# Patient Record
Sex: Female | Born: 1985 | Race: White | Hispanic: No | Marital: Married | Smoking: Former smoker
Health system: Southern US, Community
[De-identification: ages and names within clinical notes are randomized; demographics above are authoritative.]

## PROBLEM LIST (undated history)

## (undated) ENCOUNTER — Inpatient Hospital Stay: Payer: Self-pay

## (undated) DIAGNOSIS — B9689 Other specified bacterial agents as the cause of diseases classified elsewhere: Secondary | ICD-10-CM

## (undated) DIAGNOSIS — N760 Acute vaginitis: Secondary | ICD-10-CM

---

## 2015-09-10 ENCOUNTER — Encounter: Payer: Self-pay | Admitting: *Deleted

## 2015-09-10 ENCOUNTER — Emergency Department
Admission: EM | Admit: 2015-09-10 | Discharge: 2015-09-11 | Disposition: A | Discharge status: Home / Self Care | Payer: Medicaid Other | Attending: Emergency Medicine | Admitting: Emergency Medicine

## 2015-09-10 DIAGNOSIS — Z79899 Other long term (current) drug therapy: Secondary | ICD-10-CM | POA: Diagnosis not present

## 2015-09-10 DIAGNOSIS — O210 Mild hyperemesis gravidarum: Secondary | ICD-10-CM | POA: Insufficient documentation

## 2015-09-10 DIAGNOSIS — Z87891 Personal history of nicotine dependence: Secondary | ICD-10-CM | POA: Insufficient documentation

## 2015-09-10 DIAGNOSIS — Z3A01 Less than 8 weeks gestation of pregnancy: Secondary | ICD-10-CM | POA: Diagnosis not present

## 2015-09-10 HISTORY — DX: Other specified bacterial agents as the cause of diseases classified elsewhere: B96.89

## 2015-09-10 HISTORY — DX: Acute vaginitis: N76.0

## 2015-09-10 LAB — URINALYSIS COMPLETE WITH MICROSCOPIC (ARMC ONLY)
Bilirubin Urine: NEGATIVE
Glucose, UA: NEGATIVE mg/dL
Hgb urine dipstick: NEGATIVE
KETONES UR: NEGATIVE mg/dL
Leukocytes, UA: NEGATIVE
Nitrite: NEGATIVE
PROTEIN: NEGATIVE mg/dL
RBC / HPF: NONE SEEN RBC/hpf (ref 0–5)
Specific Gravity, Urine: 1.012 (ref 1.005–1.030)
pH: 9 — ABNORMAL HIGH (ref 5.0–8.0)

## 2015-09-10 LAB — POCT PREGNANCY, URINE: PREG TEST UR: POSITIVE — AB

## 2015-09-10 NOTE — ED Notes (Signed)
Pregnancy POCT results were Positive.

## 2015-09-10 NOTE — ED Notes (Signed)
Pt c/o unrelenting nausea x 3 days. Pt denies vomiting. Pt states she is unable to work secondary to constant nausea. Pt has had no prenatal care up to this point. Pt states every past pregnancy she required medication throughout to prevent nausea.

## 2015-09-11 LAB — COMPREHENSIVE METABOLIC PANEL
ALT: 11 U/L — ABNORMAL LOW (ref 14–54)
ANION GAP: 7 (ref 5–15)
AST: 16 U/L (ref 15–41)
Albumin: 4.1 g/dL (ref 3.5–5.0)
Alkaline Phosphatase: 50 U/L (ref 38–126)
BILIRUBIN TOTAL: 0.6 mg/dL (ref 0.3–1.2)
BUN: 10 mg/dL (ref 6–20)
CHLORIDE: 106 mmol/L (ref 101–111)
CO2: 24 mmol/L (ref 22–32)
Calcium: 8.7 mg/dL — ABNORMAL LOW (ref 8.9–10.3)
Creatinine, Ser: 0.81 mg/dL (ref 0.44–1.00)
GFR calc Af Amer: >60 mL/min (ref >60)
Glucose, Bld: 93 mg/dL (ref 65–99)
POTASSIUM: 3.8 mmol/L (ref 3.5–5.1)
Sodium: 137 mmol/L (ref 135–145)
TOTAL PROTEIN: 7 g/dL (ref 6.5–8.1)

## 2015-09-11 LAB — CBC WITH DIFFERENTIAL/PLATELET
BASOS ABS: 0 10*3/uL (ref 0–0.1)
Basophils Relative: 1 %
Eosinophils Absolute: 0 10*3/uL (ref 0–0.7)
Eosinophils Relative: 1 %
HEMATOCRIT: 37.6 % (ref 35.0–47.0)
HEMOGLOBIN: 12.5 g/dL (ref 12.0–16.0)
LYMPHS PCT: 21 %
Lymphs Abs: 2.1 10*3/uL (ref 1.0–3.6)
MCH: 26.2 pg (ref 26.0–34.0)
MCHC: 33.2 g/dL (ref 32.0–36.0)
MCV: 78.8 fL — AB (ref 80.0–100.0)
Monocytes Absolute: 0.5 10*3/uL (ref 0.2–0.9)
Monocytes Relative: 5 %
NEUTROS ABS: 7.4 10*3/uL — AB (ref 1.4–6.5)
NEUTROS PCT: 72 %
PLATELETS: 169 10*3/uL (ref 150–440)
RBC: 4.77 MIL/uL (ref 3.80–5.20)
RDW: 17.4 % — ABNORMAL HIGH (ref 11.5–14.5)
WBC: 10.1 10*3/uL (ref 3.6–11.0)

## 2015-09-11 LAB — OB RESULTS CONSOLE RPR: RPR: NONREACTIVE

## 2015-09-11 NOTE — Discharge Instructions (Signed)
Morning Sickness Morning sickness is when you feel sick to your stomach (nauseous) during pregnancy. You may feel sick to your stomach and throw up (vomit). You may feel sick in the morning, but you can feel this way any time of day. Some women feel very sick to their stomach and cannot stop throwing up (hyperemesis gravidarum). HOME CARE  Only take medicines as told by your doctor.  Take multivitamins as told by your doctor. Taking multivitamins before getting pregnant can stop or lessen the harshness of morning sickness.  Eat dry toast or unsalted crackers before getting out of bed.  Eat 5 to 6 small meals a day.  Eat dry and bland foods like rice and baked potatoes.  Do not drink liquids with meals. Drink between meals.  Do not eat greasy, fatty, or spicy foods.  Have someone cook for you if the smell of food causes you to feel sick or throw up.  If you feel sick to your stomach after taking prenatal vitamins, take them at night or with a snack.  Eat protein when you need a snack (nuts, yogurt, cheese).  Eat unsweetened gelatins for dessert.  Wear a bracelet used for sea sickness (acupressure wristband).  Go to a doctor that puts thin needles into certain body points (acupuncture) to improve how you feel.  Do not smoke.  Use a humidifier to keep the air in your house free of odors.  Get lots of fresh air. GET HELP IF:  You need medicine to feel better.  You feel dizzy or lightheaded.  You are losing weight. GET HELP RIGHT AWAY IF:   You feel very sick to your stomach and cannot stop throwing up.  You pass out (faint). MAKE SURE YOU:  Understand these instructions.  Will watch your condition.  Will get help right away if you are not doing well or get worse. Document Released: 01/18/2005 Document Revised: 12/16/2013 Document Reviewed: 05/28/2013 [facility] Patient Information 2015 [place], [place]. This information is not intended to replace advice given to you by  your health care provider. Make sure you discuss any questions you have with your health care provider.  Continue to try the ginger ale. He can also try to see if Medicaid will cover the likely just medicine. If not I will chew the prescription for the doxylamine and histamine and the pyridoxine or vitamin B6 separately they may cover that work might be cheap enough he can bite without any difficulty. If not try either the Phenergan first or the Zofran and afterwards picture to follow up with your doctor as planned

## 2015-09-11 NOTE — ED Provider Notes (Signed)
[facility] Emergency Department Nichole Krause Note  ____________________________________________  Time seen: Approximately 12:33 AM  I have reviewed the triage vital signs and the nursing notes.   HISTORY  Chief Complaint Morning Sickness    HPI Nichole Krause is a 29 y.o. female who complains of morning sickness. Patient's been nauseated almost constantly for several days. Patient reports with her last 2 pregnancies she is Zofran. I discussed with them in new recommendations to try either ginger and patient reports she's been used drinking ginger ale without any improvement. Or the vitamin B6 and doxylamine. We will try that first and then I will give her a prescription also for Phenergan 12.5 up to 3 times a day if needed patient reports a 25 mg has made her sleepy in the past and if that doesn't work will try the Zofran. She denies any fever chills dysuria urgency frequency or any other complaints only of nausea.   Past Medical History  Diagnosis Date  . BV (bacterial vaginosis)     There are no active problems to display for this patient.   History reviewed. No pertinent past surgical history.  Current Outpatient Rx  Name  Route  Sig  Dispense  Refill  . Prenatal Vit-Fe Fumarate-FA (PRENATAL MULTIVITAMIN) TABS tablet   Oral   Take 1 tablet by mouth daily at 12 noon.           Allergies Review of patient's allergies indicates no known allergies.  History reviewed. No pertinent family history.  Social History Social History  Substance Use Topics  . Smoking status: Former Games developer  . Smokeless tobacco: Never Used  . Alcohol Use: No    Review of Systems Constitutional: No fever/chills Eyes: No visual changes. ENT: No sore throat. Cardiovascular: Denies chest pain. Respiratory: Denies shortness of breath. Gastrointestinal: No abdominal pain.  No nausea, no vomiting.  No diarrhea.  No constipation. Genitourinary: Negative for  dysuria. Musculoskeletal: Negative for back pain. Skin: Negative for rash. Neurological: Negative for headaches, focal weakness or numbness. { 10-point ROS otherwise negative.  ____________________________________________   PHYSICAL EXAM:  VITAL SIGNS: ED Triage Vitals  Enc Vitals Group     BP 09/10/15 1941 116/69 mmHg     Pulse Rate 09/10/15 1941 79     Resp 09/10/15 1941 16     Temp 09/10/15 1941 97.6 F (36.4 C)     Temp Source 09/10/15 1941 Oral     SpO2 09/10/15 1941 99 %     Weight 09/10/15 1941 121 lb (54.885 kg)     Height 09/10/15 1941  (1.575 m)     Head Cir --      Peak Flow --      Pain Score --      Pain Loc --      Pain Edu? --      Excl. in GC? --     Constitutional: Alert and oriented. Well appearing and in no acute distress. Eyes: Conjunctivae are normal. PERRL. EOMI. Head: Atraumatic. Nose: No congestion/rhinnorhea. Mouth/Throat: Mucous membranes are moist.  Oropharynx non-erythematous. Neck: No stridor.   Cardiovascular: Normal rate, regular rhythm. Grossly normal heart sounds.  Good peripheral circulation. Respiratory: Normal respiratory effort.  No retractions. Lungs CTAB. Gastrointestinal: Soft and nontender. No distention. No abdominal bruits. No CVA tenderness. Musculoskeletal: No lower extremity tenderness nor edema.  No joint effusions. Neurologic:  Normal speech and language. No gross focal neurologic deficits are appreciated. No gait instability. Skin:  Skin is warm, dry  and intact. No rash noted. Psychiatric: Mood and affect are normal. Speech and behavior are normal.  ____________________________________________   LABS (all labs ordered are listed, but only abnormal results are displayed)  Labs Reviewed  URINALYSIS COMPLETEWITH MICROSCOPIC ([facility] ONLY) - Abnormal; Notable for the following:    Color, Urine YELLOW (*)    APPearance HAZY (*)    pH 9.0 (*)    Bacteria, UA RARE (*)    Squamous Epithelial / LPF 6-30 (*)    All  other components within normal limits  COMPREHENSIVE METABOLIC PANEL - Abnormal; Notable for the following:    Calcium 8.7 (*)    ALT 11 (*)    All other components within normal limits  CBC WITH DIFFERENTIAL/PLATELET - Abnormal; Notable for the following:    MCV 78.8 (*)    RDW 17.4 (*)    Neutro Abs 7.4 (*)    All other components within normal limits  POCT PREGNANCY, URINE - Abnormal; Notable for the following:    Preg Test, Ur POSITIVE (*)    All other components within normal limits  URINE CULTURE  POC URINE PREG, ED   ____________________________________________  EKG   ____________________________________________  RADIOLOGY   ____________________________________________   PROCEDURES    ____________________________________________   INITIAL IMPRESSION / ASSESSMENT AND PLAN / ED COURSE  Pertinent labs & imaging results that were available during my care of the patient were reviewed by me and considered in my medical decision making (see chart for details).   ____________________________________________   FINAL CLINICAL IMPRESSION(S) / ED DIAGNOSES  Final diagnoses:  Morning sickness      Arnaldo Natal, MD 09/11/15 [phone]

## 2015-09-11 NOTE — ED Notes (Signed)
Pt states is approx [redacted] weeks pregnant. Pt states "i'm nauseated, everyday, all the time. They have had to give me zofran with all my other pregnancies." pt denies pain. Pt states has been taking metronizole vaginal gel "for an infection". Pt denies diarrhea. Skin normal color warm and dry. Vss.

## 2015-09-13 LAB — URINE CULTURE: SPECIAL REQUESTS: NORMAL

## 2015-10-12 LAB — OB RESULTS CONSOLE HIV ANTIBODY (ROUTINE TESTING): HIV: NONREACTIVE

## 2015-10-12 LAB — OB RESULTS CONSOLE VARICELLA ZOSTER ANTIBODY, IGG: Varicella: UNDETERMINED

## 2015-10-12 LAB — OB RESULTS CONSOLE HEPATITIS B SURFACE ANTIGEN: Hepatitis B Surface Ag: NEGATIVE

## 2015-10-12 LAB — OB RESULTS CONSOLE RUBELLA ANTIBODY, IGM: Rubella: IMMUNE

## 2015-10-14 ENCOUNTER — Emergency Department
Admission: EM | Admit: 2015-10-14 | Discharge: 2015-10-14 | Disposition: A | Discharge status: Home / Self Care | Payer: Medicaid Other | Attending: Emergency Medicine | Admitting: Emergency Medicine

## 2015-10-14 ENCOUNTER — Encounter: Payer: Self-pay | Admitting: Emergency Medicine

## 2015-10-14 DIAGNOSIS — O30091 Twin pregnancy, unable to determine number of placenta and number of amniotic sacs, first trimester: Secondary | ICD-10-CM | POA: Insufficient documentation

## 2015-10-14 DIAGNOSIS — O210 Mild hyperemesis gravidarum: Secondary | ICD-10-CM | POA: Diagnosis not present

## 2015-10-14 DIAGNOSIS — Z79899 Other long term (current) drug therapy: Secondary | ICD-10-CM | POA: Diagnosis not present

## 2015-10-14 DIAGNOSIS — Z3491 Encounter for supervision of normal pregnancy, unspecified, first trimester: Secondary | ICD-10-CM

## 2015-10-14 DIAGNOSIS — Z3A10 10 weeks gestation of pregnancy: Secondary | ICD-10-CM | POA: Insufficient documentation

## 2015-10-14 DIAGNOSIS — Z87891 Personal history of nicotine dependence: Secondary | ICD-10-CM | POA: Diagnosis not present

## 2015-10-14 LAB — CBC WITH DIFFERENTIAL/PLATELET
BASOS ABS: 0 10*3/uL (ref 0–0.1)
BASOS PCT: 0 %
EOS PCT: 0 %
Eosinophils Absolute: 0 10*3/uL (ref 0–0.7)
HCT: 40 % (ref 35.0–47.0)
Hemoglobin: 13.6 g/dL (ref 12.0–16.0)
Lymphocytes Relative: 13 %
Lymphs Abs: 1.3 10*3/uL (ref 1.0–3.6)
MCH: 27.1 pg (ref 26.0–34.0)
MCHC: 34 g/dL (ref 32.0–36.0)
MCV: 79.9 fL — ABNORMAL LOW (ref 80.0–100.0)
MONO ABS: 0.5 10*3/uL (ref 0.2–0.9)
Monocytes Relative: 5 %
NEUTROS ABS: 8.5 10*3/uL — AB (ref 1.4–6.5)
Neutrophils Relative %: 82 %
PLATELETS: 173 10*3/uL (ref 150–440)
RBC: 5.01 MIL/uL (ref 3.80–5.20)
RDW: 18.4 % — AB (ref 11.5–14.5)
WBC: 10.3 10*3/uL (ref 3.6–11.0)

## 2015-10-14 LAB — URINALYSIS COMPLETE WITH MICROSCOPIC (ARMC ONLY)
BILIRUBIN URINE: NEGATIVE
Glucose, UA: >500 mg/dL — AB
Hgb urine dipstick: NEGATIVE
KETONES UR: NEGATIVE mg/dL
LEUKOCYTES UA: NEGATIVE
NITRITE: NEGATIVE
Protein, ur: NEGATIVE mg/dL
Specific Gravity, Urine: 1.017 (ref 1.005–1.030)
pH: 8 (ref 5.0–8.0)

## 2015-10-14 LAB — BASIC METABOLIC PANEL
ANION GAP: 8 (ref 5–15)
BUN: 9 mg/dL (ref 6–20)
CALCIUM: 9.1 mg/dL (ref 8.9–10.3)
CO2: 28 mmol/L (ref 22–32)
CREATININE: 0.72 mg/dL (ref 0.44–1.00)
Chloride: 102 mmol/L (ref 101–111)
GFR calc Af Amer: >60 mL/min (ref >60)
GLUCOSE: 104 mg/dL — AB (ref 65–99)
Potassium: 3.5 mmol/L (ref 3.5–5.1)
Sodium: 138 mmol/L (ref 135–145)

## 2015-10-14 MED ORDER — DEXTROSE 5 % AND 0.9 % NACL IV BOLUS
1000.0000 mL | Freq: Once | INTRAVENOUS | Status: AC
  Administered 2015-10-14: 1000 mL via INTRAVENOUS
  Filled 2015-10-14: qty 1000

## 2015-10-14 MED ORDER — PROMETHAZINE HCL 25 MG/ML IJ SOLN
25.0000 mg | Freq: Once | INTRAMUSCULAR | Status: AC
  Administered 2015-10-14: 25 mg via INTRAVENOUS
  Filled 2015-10-14: qty 1

## 2015-10-14 MED ORDER — ONDANSETRON HCL 4 MG/2ML IJ SOLN
4.0000 mg | Freq: Once | INTRAMUSCULAR | Status: AC
  Administered 2015-10-14: 4 mg via INTRAVENOUS
  Filled 2015-10-14: qty 2

## 2015-10-14 MED ORDER — SODIUM CHLORIDE 0.9 % IV BOLUS (SEPSIS)
1000.0000 mL | Freq: Once | INTRAVENOUS | Status: AC
  Administered 2015-10-14: 1000 mL via INTRAVENOUS

## 2015-10-14 MED ORDER — DIPHENHYDRAMINE HCL 50 MG/ML IJ SOLN
25.0000 mg | Freq: Once | INTRAMUSCULAR | Status: AC
  Administered 2015-10-14: 25 mg via INTRAVENOUS
  Filled 2015-10-14: qty 1

## 2015-10-14 MED ORDER — PROMETHAZINE HCL 25 MG PO TABS: 25.0000 mg | ORAL_TABLET | Freq: Four times a day (QID) | ORAL | Status: DC | PRN

## 2015-10-14 NOTE — Discharge Instructions (Signed)
First Trimester of Pregnancy  The first trimester of pregnancy is from week 1 until the end of week 12 (months 1 through 3). A week after a sperm fertilizes an egg, the egg will implant on the wall of the uterus. This embryo will begin to develop into a baby. Genes from you and your partner are forming the baby. The female genes determine whether the baby is a boy or a girl. At 6-8 weeks, the eyes and face are formed, and the heartbeat can be seen on ultrasound. At the end of 12 weeks, all the baby's organs are formed.   Now that you are pregnant, you will want to do everything you can to have a healthy baby. Two of the most important things are to get good prenatal care and to follow your health care provider's instructions. Prenatal care is all the medical care you receive before the baby's birth. This care will help prevent, find, and treat any problems during the pregnancy and childbirth.  BODY CHANGES  Your body goes through many changes during pregnancy. The changes vary from woman to woman.   · You may gain or lose a couple of pounds at first.  · You may feel sick to your stomach (nauseous) and throw up (vomit). If the vomiting is uncontrollable, call your health care provider.  · You may tire easily.  · You may develop headaches that can be relieved by medicines approved by your health care provider.  · You may urinate more often. Painful urination may mean you have a bladder infection.  · You may develop heartburn as a result of your pregnancy.  · You may develop constipation because certain hormones are causing the muscles that push waste through your intestines to slow down.  · You may develop hemorrhoids or swollen, bulging veins (varicose veins).  · Your breasts may begin to grow larger and become tender. Your nipples may stick out more, and the tissue that surrounds them (areola) may become darker.  · Your gums may bleed and may be sensitive to brushing and flossing.   · Dark spots or blotches (chloasma, mask of pregnancy) may develop on your face. This will likely fade after the baby is born.  · Your menstrual periods will stop.  · You may have a loss of appetite.  · You may develop cravings for certain kinds of food.  · You may have changes in your emotions from day to day, such as being excited to be pregnant or being concerned that something may go wrong with the pregnancy and baby.  · You may have more vivid and strange dreams.  · You may have changes in your hair. These can include thickening of your hair, rapid growth, and changes in texture. Some women also have hair loss during or after pregnancy, or hair that feels dry or thin. Your hair will most likely return to normal after your baby is born.  WHAT TO EXPECT AT YOUR PRENATAL VISITS  During a routine prenatal visit:  · You will be weighed to make sure you and the baby are growing normally.  · Your blood pressure will be taken.  · Your abdomen will be measured to track your baby's growth.  · The fetal heartbeat will be listened to starting around week 10 or 12 of your pregnancy.  · Test results from any previous visits will be discussed.  Your health care provider may ask you:  · How you are feeling.  · If you   including cigarettes, chewing tobacco, and electronic cigarettes. °· If you have any questions. °Other tests that may be performed during your first trimester include: °· Blood tests to find your blood type and to check for the presence of any previous infections. They will also be used to check for low iron levels (anemia) and Rh antibodies. Later in the pregnancy, blood tests for diabetes will be done along with other tests if problems develop. °· Urine tests to check for infections, diabetes, or protein in the urine. °· An ultrasound to confirm the proper growth  and development of the baby. °· An amniocentesis to check for possible genetic problems. °· Fetal screens for spina bifida and Down syndrome. °· You may need other tests to make sure you and the baby are doing well. °· HIV (human immunodeficiency virus) testing. Routine prenatal testing includes screening for HIV, unless you choose not to have this test. °HOME CARE INSTRUCTIONS  °Medicines °· Follow your health care provider's instructions regarding medicine use. Specific medicines may be either safe or unsafe to take during pregnancy. °· Take your prenatal vitamins as directed. °· If you develop constipation, try taking a stool softener if your health care provider approves. °Diet °· Eat regular, well-balanced meals. Choose a variety of foods, such as meat or vegetable-based protein, fish, milk and low-fat dairy products, vegetables, fruits, and whole grain breads and cereals. Your health care provider will help you determine the amount of weight gain that is right for you. °· Avoid raw meat and uncooked cheese. These carry germs that can cause birth defects in the baby. °· Eating four or five small meals rather than three large meals a day may help relieve nausea and vomiting. If you start to feel nauseous, eating a few soda crackers can be helpful. Drinking liquids between meals instead of during meals also seems to help nausea and vomiting. °· If you develop constipation, eat more high-fiber foods, such as fresh vegetables or fruit and whole grains. Drink enough fluids to keep your urine clear or pale yellow. °Activity and Exercise °· Exercise only as directed by your health care provider. Exercising will help you: °¨ Control your weight. °¨ Stay in shape. °¨ Be prepared for labor and delivery. °· Experiencing pain or cramping in the lower abdomen or low back is a good sign that you should stop exercising. Check with your health care provider before continuing normal exercises. °· Try to avoid standing for long  periods of time. Move your legs often if you must stand in one place for a long time. °· Avoid heavy lifting. °· Wear low-heeled shoes, and practice good posture. °· You may continue to have sex unless your health care provider directs you otherwise. °Relief of Pain or Discomfort °· Wear a good support bra for breast tenderness.   °· Take warm sitz baths to soothe any pain or discomfort caused by hemorrhoids. Use hemorrhoid cream if your health care provider approves.   °· Rest with your legs elevated if you have leg cramps or low back pain. °· If you develop varicose veins in your legs, wear support hose. Elevate your feet for 15 minutes, 3-4 times a day. Limit salt in your diet. °Prenatal Care °· Schedule your prenatal visits by the twelfth week of pregnancy. They are usually scheduled monthly at first, then more often in the last 2 months before delivery. °· Write down your questions. Take them to your prenatal visits. °· Keep all your prenatal visits as directed by your   health care provider. Safety  Wear your seat belt at all times when driving.  Make a list of emergency phone numbers, including numbers for family, friends, the hospital, and police and fire departments. General Tips  Ask your health care provider for a referral to a local prenatal education class. Begin classes no later than at the beginning of month 6 of your pregnancy.  Ask for help if you have counseling or nutritional needs during pregnancy. Your health care provider can offer advice or refer you to specialists for help with various needs.  Do not use hot tubs, steam rooms, or saunas.  Do not douche or use tampons or scented sanitary pads.  Do not cross your legs for long periods of time.  Avoid cat litter boxes and soil used by cats. These carry germs that can cause birth defects in the baby and possibly loss of the fetus by miscarriage or stillbirth.  Avoid all smoking, herbs, alcohol, and medicines not prescribed by  your health care provider. Chemicals in these affect the formation and growth of the baby.  Do not use any tobacco products, including cigarettes, chewing tobacco, and electronic cigarettes. If you need help quitting, ask your health care provider. You may receive counseling support and other resources to help you quit.  Schedule a dentist appointment. At home, brush your teeth with a soft toothbrush and be gentle when you floss. SEEK MEDICAL CARE IF:   You have dizziness.  You have mild pelvic cramps, pelvic pressure, or nagging pain in the abdominal area.  You have persistent nausea, vomiting, or diarrhea.  You have a bad smelling vaginal discharge.  You have pain with urination.  You notice increased swelling in your face, hands, legs, or ankles. SEEK IMMEDIATE MEDICAL CARE IF:   You have a fever.  You are leaking fluid from your vagina.  You have spotting or bleeding from your vagina.  You have severe abdominal cramping or pain.  You have rapid weight gain or loss.  You vomit blood or material that looks like coffee grounds.  You are exposed to [place] measles and have never had them.  You are exposed to fifth disease or chickenpox.  You develop a severe headache.  You have shortness of breath.  You have any kind of trauma, such as from a fall or a car accident.   This information is not intended to replace advice given to you by your health care provider. Make sure you discuss any questions you have with your health care provider.   Document Released: 12/05/2001 Document Revised: 01/01/2015 Document Reviewed: 10/21/2013 Elsevier Interactive Patient Education 2016 [employer].  Eating Plan for Hyperemesis Gravidarum Severe cases of hyperemesis gravidarum can lead to dehydration and malnutrition. The hyperemesis eating plan is one way to lessen the symptoms of nausea and vomiting. It is often used with prescribed medicines to control your symptoms.  WHAT CAN I DO  TO RELIEVE MY SYMPTOMS? Listen to your body. Everyone is different and has different preferences. Find what works best for you. Some of the following things may help:  Eat and drink slowly.  Eat 5-6 small meals daily instead of 3 large meals.   Eat crackers before you get out of bed in the morning.   Starchy foods are usually well tolerated (such as cereal, toast, bread, potatoes, pasta, rice, and pretzels).   Ginger may help with nausea. Add  tsp ground ginger to hot tea or choose ginger tea.   Try drinking 100%  fruit juice or an electrolyte drink.  Continue to take your prenatal vitamins as directed by your health care provider. If you are having trouble taking your prenatal vitamins, talk with your health care provider about different options.  Include at least 1 serving of protein with your meals and snacks (such as meats or poultry, beans, nuts, eggs, or yogurt). Try eating a protein-rich snack before bed (such as cheese and crackers or a half [place] or peanut butter sandwich). WHAT THINGS SHOULD I AVOID TO REDUCE MY SYMPTOMS? The following things may help reduce your symptoms:  Avoid foods with strong smells. Try eating meals in well-ventilated areas that are free of odors.  Avoid drinking water or other beverages with meals. Try not to drink anything less than 30 minutes before and after meals.  Avoid drinking more than 1 cup of fluid at a time.  Avoid fried or high-fat foods, such as butter and cream sauces.  Avoid spicy foods.  Avoid skipping meals the best you can. Nausea can be more intense on an empty stomach. If you cannot tolerate food at that time, do not force it. Try sucking on ice chips or other frozen items and make up the calories later.  Avoid lying down within 2 hours after eating.   This information is not intended to replace advice given to you by your health care provider. Make sure you discuss any questions you have with your health care provider.     Document Released: 10/08/2007 Document Revised: 12/16/2013 Document Reviewed: 10/15/2013 Elsevier Interactive Patient Education 2016 [employer] Inc[employer]  Morning Sickness Morning sickness is when you feel sick to your stomach (nauseous) during pregnancy. This nauseous feeling may or may not come with vomiting. It often occurs in the morning but can be a problem any time of day. Morning sickness is most common during the first trimester, but it may continue throughout pregnancy. While morning sickness is unpleasant, it is usually harmless unless you develop severe and continual vomiting (hyperemesis gravidarum). This condition requires more intense treatment.  CAUSES  The cause of morning sickness is not completely known but seems to be related to normal hormonal changes that occur in pregnancy. RISK FACTORS You are at greater risk if you:  Experienced nausea or vomiting before your pregnancy.  Had morning sickness during a previous pregnancy.  Are pregnant with more than one baby, such as twins. TREATMENT  Do not use any medicines (prescription, over-the-counter, or herbal) for morning sickness without first talking to your health care provider. Your health care provider may prescribe or recommend:  Vitamin B6 supplements.  Anti-nausea medicines.  The herbal medicine ginger. HOME CARE INSTRUCTIONS   Only take over-the-counter or prescription medicines as directed by your health care provider.  Taking multivitamins before getting pregnant can prevent or decrease the severity of morning sickness in most women.  Eat a piece of dry toast or unsalted crackers before getting out of bed in the morning.  Eat five or six small meals a day.  Eat dry and bland foods (rice, baked potato). Foods high in carbohydrates are often helpful.  Do not drink liquids with your meals. Drink liquids between meals.  Avoid greasy, fatty, and spicy foods.  Get someone to cook for you if the smell of any food  causes nausea and vomiting.  If you feel nauseous after taking prenatal vitamins, take the vitamins at night or with a snack.  Snack on protein foods (nuts, yogurt, cheese) between meals if you are hungry.  Eat unsweetened gelatins for desserts.  Wearing an acupressure wristband (worn for sea sickness) may be helpful.  Acupuncture may be helpful.  Do not smoke.  Get a humidifier to keep the air in your house free of odors.  Get plenty of fresh air. SEEK MEDICAL CARE IF:   Your home remedies are not working, and you need medicine.  You feel dizzy or lightheaded.  You are losing weight. SEEK IMMEDIATE MEDICAL CARE IF:   You have persistent and uncontrolled nausea and vomiting.  You pass out (faint). MAKE SURE YOU:  Understand these instructions.  Will watch your condition.  Will get help right away if you are not doing well or get worse.   This information is not intended to replace advice given to you by your health care provider. Make sure you discuss any questions you have with your health care provider.   Document Released: 02/01/2007 Document Revised: 12/16/2013 Document Reviewed: 05/28/2013 Elsevier Interactive Patient Education [employer]

## 2015-10-14 NOTE — ED Notes (Signed)
[redacted] weeks pregnant with twins and has vomiting.

## 2015-10-14 NOTE — ED Provider Notes (Signed)
[facility] Emergency Department Provider Note  ____________________________________________  Time seen: 8:30 AM  I have reviewed the triage vital signs and the nursing notes.   HISTORY  Chief Complaint Emesis During Pregnancy    HPI Nichole Krause is a 29 y.o. female [redacted] weeks pregnant with twins is been having nausea vomiting and oral intolerance for the past 2 days.She has a first appointment with the [facility] obstetric[facility] clinic in 4 days. No fever or chills vision changes headache or syncope. She does have nausea and vomiting, no diarrhea. No sick contacts, no chest pain shortness of breath. No contractions leakage of fluid vaginal bleeding.     Past Medical History  Diagnosis Date  . BV (bacterial vaginosis)      There are no active problems to display for this patient.  19 pregnancy, first trimester   No past surgical history on file. Negative  Current Outpatient Rx  Name  Route  Sig  Dispense  Refill  . Prenatal Vit-Fe Fumarate-FA (PRENATAL VITAMIN) 27-0.8 MG TABS   Oral   Take 1 tablet by mouth daily.         . prochlorperazine (COMPAZINE) 10 MG tablet   Oral   Take 10 mg by mouth every 6 (six) hours as needed for nausea or vomiting.          . promethazine (PHENERGAN) 25 MG tablet   Oral   Take 1 tablet (25 mg total) by mouth every 6 (six) hours as needed for nausea or vomiting.   15 tablet   0      Allergies Review of patient's allergies indicates no known allergies.   No family history on file.  Social History Social History  Substance Use Topics  . Smoking status: Former Games developermoker  . Smokeless tobacco: Never Used  . Alcohol Use: No    Review of Systems  Constitutional:   No fever or chills. No weight changes Eyes:   No blurry vision or double vision.  ENT:   No sore throat. Cardiovascular:   No chest pain. Respiratory:   No dyspnea or cough. Gastrointestinal:   Negative for abdominal pain, positive  vomiting without diarrhea .  No BRBPR or melena. Genitourinary:   Negative for dysuria, urinary retention, bloody urine, or difficulty urinating. Musculoskeletal:   Negative for back pain. No joint swelling or pain. Skin:   Negative for rash. Neurological:   Negative for headaches, focal weakness or numbness. Psychiatric:  No anxiety or depression.   Endocrine:  No hot/cold intolerance, changes in energy, or sleep difficulty.  10-point ROS otherwise negative.  ____________________________________________   PHYSICAL EXAM:  VITAL SIGNS: ED Triage Vitals  Enc Vitals Group     BP 10/14/15 0900 113/78 mmHg     Pulse Rate 10/14/15 0900 70     Resp --      Temp --      Temp src --      SpO2 10/14/15 0900 100 %     Weight --      Height --      Head Cir --      Peak Flow --      Pain Score 10/14/15 0804 6     Pain Loc --      Pain Edu? --      Excl. in GC? --      Constitutional:   Alert and oriented. Uncomfortable appearing and in no distress. Eyes:   No scleral icterus. No conjunctival pallor. PERRL.  EOMI ENT   Head:   Normocephalic and atraumatic.   Nose:   No congestion/rhinnorhea. No septal hematoma   Mouth/Throat:   MMM, no pharyngeal erythema. No peritonsillar mass. No uvula shift.   Neck:   No stridor. No SubQ emphysema. No meningismus. Hematological/Lymphatic/Immunilogical:   No cervical lymphadenopathy. Cardiovascular:   RRR. Normal and symmetric distal pulses are present in all extremities. No murmurs, rubs, or gallops. Respiratory:   Normal respiratory effort without tachypnea nor retractions. Breath sounds are clear and equal bilaterally. No wheezes/rales/rhonchi. Gastrointestinal:   Soft and nontender. No distention. There is no CVA tenderness.  No rebound, rigidity, or guarding. Genitourinary:   deferred Musculoskeletal:   Nontender with normal range of motion in all extremities. No joint effusions.  No lower extremity tenderness.  No  edema. Neurologic:   Normal speech and language.  CN 2-10 normal. Motor grossly intact. No pronator drift.  Normal gait. No gross focal neurologic deficits are appreciated.  Skin:    Skin is warm, dry and intact. No rash noted.  No petechiae, purpura, or bullae. Psychiatric:   Mood and affect are normal. Speech and behavior are normal. Patient exhibits appropriate insight and judgment.  ____________________________________________    LABS (pertinent positives/negatives) (all labs ordered are listed, but only abnormal results are displayed) Labs Reviewed  BASIC METABOLIC PANEL - Abnormal; Notable for the following:    Glucose, Bld 104 (*)    All other components within normal limits  CBC WITH DIFFERENTIAL/PLATELET - Abnormal; Notable for the following:    MCV 79.9 (*)    RDW 18.4 (*)    Neutro Abs 8.5 (*)    All other components within normal limits  URINALYSIS COMPLETEWITH MICROSCOPIC ([facility] ONLY)   ____________________________________________   EKG    ____________________________________________    RADIOLOGY    ____________________________________________   PROCEDURES   ____________________________________________   INITIAL IMPRESSION / ASSESSMENT AND PLAN / ED COURSE  Pertinent labs & imaging results that were available during my care of the patient were reviewed by me and considered in my medical decision making (see chart for details).  Patient presents with nausea and vomiting likely nausea vomiting of pregnancy/morning sickness. With a few days of complete oral intolerance, concern for hyperemesis and ketosis. We'll check labs and urinalysis, give D5 saline and additional saline bolus. Phenergan for nausea control.  ----------------------------------------- 1:26 PM on 10/14/2015 -----------------------------------------  On multiple reassessments, nausea has resolved and patient is tolerating oral intake eating and drinking. She is calm and  comfortable. She is now been able to provide a urine sample so we will check that and plan for discharge and follow-up with obstetrics in 4 days. No evidence of new complication with pregnancy. No evidence of GI pathology.     ____________________________________________   FINAL CLINICAL IMPRESSION(S) / ED DIAGNOSES  Final diagnoses:  Morning sickness  First trimester pregnancy      Sharman Cheek, MD 10/14/15 1327

## 2015-10-18 LAB — OB RESULTS CONSOLE GC/CHLAMYDIA
CHLAMYDIA, DNA PROBE: NEGATIVE
Gonorrhea: NEGATIVE

## 2015-12-30 ENCOUNTER — Observation Stay
Admission: EM | Admit: 2015-12-30 | Discharge: 2015-12-31 | Disposition: A | Discharge status: Home / Self Care | Payer: Medicaid Other | Attending: Obstetrics & Gynecology | Admitting: Obstetrics & Gynecology

## 2015-12-30 DIAGNOSIS — O210 Mild hyperemesis gravidarum: Principal | ICD-10-CM | POA: Insufficient documentation

## 2015-12-30 DIAGNOSIS — Z3A00 Weeks of gestation of pregnancy not specified: Secondary | ICD-10-CM | POA: Insufficient documentation

## 2015-12-30 DIAGNOSIS — R1110 Vomiting, unspecified: Secondary | ICD-10-CM | POA: Diagnosis present

## 2015-12-31 DIAGNOSIS — Z3A00 Weeks of gestation of pregnancy not specified: Secondary | ICD-10-CM | POA: Diagnosis not present

## 2015-12-31 DIAGNOSIS — O210 Mild hyperemesis gravidarum: Secondary | ICD-10-CM | POA: Diagnosis not present

## 2015-12-31 DIAGNOSIS — R1110 Vomiting, unspecified: Secondary | ICD-10-CM | POA: Diagnosis present

## 2015-12-31 LAB — URINALYSIS COMPLETE WITH MICROSCOPIC (ARMC ONLY)
BILIRUBIN URINE: NEGATIVE
GLUCOSE, UA: NEGATIVE mg/dL
HGB URINE DIPSTICK: NEGATIVE
LEUKOCYTES UA: NEGATIVE
NITRITE: NEGATIVE
Protein, ur: 30 mg/dL — AB
SPECIFIC GRAVITY, URINE: 1.025 (ref 1.005–1.030)
pH: 7 (ref 5.0–8.0)

## 2015-12-31 LAB — COMPREHENSIVE METABOLIC PANEL
ALBUMIN: 3.5 g/dL (ref 3.5–5.0)
ALK PHOS: 56 U/L (ref 38–126)
ALT: 16 U/L (ref 14–54)
ANION GAP: 9 (ref 5–15)
AST: 18 U/L (ref 15–41)
BILIRUBIN TOTAL: 0.9 mg/dL (ref 0.3–1.2)
BUN: 9 mg/dL (ref 6–20)
CALCIUM: 9 mg/dL (ref 8.9–10.3)
CO2: 24 mmol/L (ref 22–32)
Chloride: 103 mmol/L (ref 101–111)
Creatinine, Ser: 0.56 mg/dL (ref 0.44–1.00)
GFR calc non Af Amer: >60 mL/min (ref >60)
GLUCOSE: 95 mg/dL (ref 65–99)
Potassium: 3.4 mmol/L — ABNORMAL LOW (ref 3.5–5.1)
Sodium: 136 mmol/L (ref 135–145)
TOTAL PROTEIN: 7.1 g/dL (ref 6.5–8.1)

## 2015-12-31 LAB — MAGNESIUM: Magnesium: 2.1 mg/dL (ref 1.7–2.4)

## 2015-12-31 MED ORDER — PROMETHAZINE HCL 25 MG PO TABS: 25.0000 mg | ORAL_TABLET | Freq: Four times a day (QID) | ORAL | Status: DC | PRN

## 2015-12-31 MED ORDER — ALUM & MAG HYDROXIDE-SIMETH 200-200-20 MG/5ML PO SUSP
15.0000 mL | ORAL | Status: DC | PRN
  Administered 2015-12-31: 15 mL via ORAL
  Filled 2015-12-31: qty 30

## 2015-12-31 MED ORDER — PROMETHAZINE HCL 25 MG/ML IJ SOLN
25.0000 mg | Freq: Four times a day (QID) | INTRAMUSCULAR | Status: DC | PRN
  Administered 2015-12-31: 25 mg via INTRAVENOUS

## 2015-12-31 MED ORDER — PROMETHAZINE HCL 25 MG RE SUPP
25.0000 mg | Freq: Four times a day (QID) | RECTAL | Status: DC | PRN
  Filled 2015-12-31: qty 1

## 2015-12-31 MED ORDER — LACTATED RINGERS IV SOLN
INTRAVENOUS | Status: DC
  Administered 2015-12-31: 1000 mL via INTRAVENOUS

## 2015-12-31 MED ORDER — SODIUM CHLORIDE 0.9 % IV SOLN
25.0000 mg | Freq: Once | INTRAVENOUS | Status: DC
  Filled 2015-12-31 (×2): qty 1

## 2015-12-31 MED ORDER — SODIUM CHLORIDE 0.9 % IJ SOLN
INTRAMUSCULAR | Status: AC
  Filled 2015-12-31: qty 3

## 2015-12-31 NOTE — Plan of Care (Signed)
Pt taken to visitor entrance of hospital via wheelchair for discharge in stable condition with SO at side.per hospital volunteer. Ellison Carwin Daneesha Quinteros RNC

## 2015-12-31 NOTE — Final Progress Note (Signed)
Physician Final Progress Note  Patient ID: Nichole Krause MRN: [id number] DOB/AGE: 02/02/1986 30 y.o.  Admit date: 12/30/2015 Admitting provider: Maceo Pro, MD Discharge date: 12/31/2015   Admission Diagnoses: Hyperemesis  Discharge Diagnoses:  Active Problems:   Vomiting   Consults: none  Significant Findings/ Diagnostic Studies:  Recent Results (from the past 2160 hour(s))  OB RESULTS CONSOLE HIV antibody     Status: None   Collection Time: 10/12/15 12:00 AM  Result Value Ref Range   HIV Non-reactive   OB RESULTS CONSOLE Rubella Antibody     Status: None   Collection Time: 10/12/15 12:00 AM  Result Value Ref Range   Rubella Immune   OB RESULTS CONSOLE Varicella zoster antibody, IgG     Status: None   Collection Time: 10/12/15 12:00 AM  Result Value Ref Range   Varicella Equivocal   OB RESULTS CONSOLE Hepatitis B surface antigen     Status: None   Collection Time: 10/12/15 12:00 AM  Result Value Ref Range   Hepatitis B Surface Ag Negative   Basic metabolic panel     Status: Abnormal   Collection Time: 10/14/15  8:36 AM  Result Value Ref Range   Sodium 138 135 - 145 mmol/L   Potassium 3.5 3.5 - 5.1 mmol/L   Chloride 102 101 - 111 mmol/L   CO2 28 22 - 32 mmol/L   Glucose, Bld 104 (H) 65 - 99 mg/dL   BUN 9 6 - 20 mg/dL   Creatinine, Ser 0.72 0.44 - 1.00 mg/dL   Calcium 9.1 8.9 - 10.3 mg/dL   GFR calc non Af Amer >60 >60 mL/min   GFR calc Af Amer >60 >60 mL/min    Comment: (NOTE) The eGFR has been calculated using the CKD EPI equation. This calculation has not been validated in all clinical situations. eGFR's persistently <60 mL/min signify possible Chronic Kidney Disease.    Anion gap 8 5 - 15  CBC with Differential     Status: Abnormal   Collection Time: 10/14/15  8:36 AM  Result Value Ref Range   WBC 10.3 3.6 - 11.0 K/uL   RBC 5.01 3.80 - 5.20 MIL/uL   Hemoglobin 13.6 12.0 - 16.0 g/dL   HCT 40.0 35.0 - 47.0 %   MCV 79.9 (L) 80.0 - 100.0 fL   MCH 27.1  26.0 - 34.0 pg   MCHC 34.0 32.0 - 36.0 g/dL   RDW 18.4 (H) 11.5 - 14.5 %   Platelets 173 150 - 440 K/uL   Neutrophils Relative % 82 %   Neutro Abs 8.5 (H) 1.4 - 6.5 K/uL   Lymphocytes Relative 13 %   Lymphs Abs 1.3 1.0 - 3.6 K/uL   Monocytes Relative 5 %   Monocytes Absolute 0.5 0.2 - 0.9 K/uL   Eosinophils Relative 0 %   Eosinophils Absolute 0.0 0 - 0.7 K/uL   Basophils Relative 0 %   Basophils Absolute 0.0 0 - 0.1 K/uL  Urinalysis complete, with microscopic     Status: Abnormal   Collection Time: 10/14/15  1:16 PM  Result Value Ref Range   Color, Urine YELLOW (A) YELLOW   APPearance TURBID (A) CLEAR   Glucose, UA >500 (A) NEGATIVE mg/dL   Bilirubin Urine NEGATIVE NEGATIVE   Ketones, ur NEGATIVE NEGATIVE mg/dL   Specific Gravity, Urine 1.017 1.005 - 1.030   Hgb urine dipstick NEGATIVE NEGATIVE   pH 8.0 5.0 - 8.0   Protein, ur NEGATIVE NEGATIVE mg/dL   Nitrite  NEGATIVE NEGATIVE   Leukocytes, UA NEGATIVE NEGATIVE   RBC / HPF 0-5 0 - 5 RBC/hpf   WBC, UA 6-30 0 - 5 WBC/hpf   Bacteria, UA RARE (A) NONE SEEN   Squamous Epithelial / LPF 6-30 (A) NONE SEEN   WBC Clumps PRESENT    Mucous PRESENT    Amorphous Crystal PRESENT   OB RESULTS CONSOLE GC/Chlamydia     Status: None   Collection Time: 10/18/15 12:00 AM  Result Value Ref Range   Gonorrhea Negative    Chlamydia Negative   Urinalysis complete, with microscopic ([facility] only)     Status: Abnormal   Collection Time: 12/31/15  1:03 AM  Result Value Ref Range   Color, Urine AMBER (A) YELLOW   APPearance HAZY (A) CLEAR   Glucose, UA NEGATIVE NEGATIVE mg/dL   Bilirubin Urine NEGATIVE NEGATIVE   Ketones, ur 2+ (A) NEGATIVE mg/dL   Specific Gravity, Urine 1.025 1.005 - 1.030   Hgb urine dipstick NEGATIVE NEGATIVE   pH 7.0 5.0 - 8.0   Protein, ur 30 (A) NEGATIVE mg/dL   Nitrite NEGATIVE NEGATIVE   Leukocytes, UA NEGATIVE NEGATIVE   RBC / HPF 0-5 0 - 5 RBC/hpf   WBC, UA 0-5 0 - 5 WBC/hpf   Bacteria, UA RARE (A) NONE SEEN    Squamous Epithelial / LPF 6-30 (A) NONE SEEN   Mucous PRESENT    Budding Yeast PRESENT   Comprehensive metabolic panel     Status: Abnormal   Collection Time: 12/31/15  1:53 AM  Result Value Ref Range   Sodium 136 135 - 145 mmol/L   Potassium 3.4 (L) 3.5 - 5.1 mmol/L   Chloride 103 101 - 111 mmol/L   CO2 24 22 - 32 mmol/L   Glucose, Bld 95 65 - 99 mg/dL   BUN 9 6 - 20 mg/dL   Creatinine, Ser 0.56 0.44 - 1.00 mg/dL   Calcium 9.0 8.9 - 10.3 mg/dL   Total Protein 7.1 6.5 - 8.1 g/dL   Albumin 3.5 3.5 - 5.0 g/dL   AST 18 15 - 41 U/L   ALT 16 14 - 54 U/L   Alkaline Phosphatase 56 38 - 126 U/L   Total Bilirubin 0.9 0.3 - 1.2 mg/dL   GFR calc non Af Amer >60 >60 mL/min   GFR calc Af Amer >60 >60 mL/min    Comment: (NOTE) The eGFR has been calculated using the CKD EPI equation. This calculation has not been validated in all clinical situations. eGFR's persistently <60 mL/min signify possible Chronic Kidney Disease.    Anion gap 9 5 - 15  Magnesium     Status: None   Collection Time: 12/31/15  1:53 AM  Result Value Ref Range   Magnesium 2.1 1.7 - 2.4 mg/dL     Procedures: IV hydration   Discharge Condition: good  Disposition: 01-Home or Self Care  Diet: Bland diet  Discharge Activity: Activity as tolerated  Discharge Instructions    Discharge activity:  No Restrictions    Complete by:  As directed      Notify physician for a general feeling that "something is not right"    Complete by:  As directed      Notify physician for increase or change in vaginal discharge    Complete by:  As directed      Notify physician for intestinal cramps, with or without diarrhea, sometimes described as "gas pain"    Complete by:  As directed  Notify physician for leaking of fluid    Complete by:  As directed      Notify physician for low, dull backache, unrelieved by heat or Tylenol    Complete by:  As directed      Notify physician for menstrual like cramps    Complete by:  As  directed      Notify physician for pelvic pressure    Complete by:  As directed      Notify physician for uterine contractions.  These may be painless and feel like the uterus is tightening or the baby is  "balling up"    Complete by:  As directed      Notify physician for vaginal bleeding    Complete by:  As directed      PRETERM LABOR:  Includes any of the follwing symptoms that occur between 20 - [redacted] weeks gestation.  If these symptoms are not stopped, preterm labor can result in preterm delivery, placing your baby at risk    Complete by:  As directed             Medication List    TAKE these medications        Prenatal Vitamin 27-0.8 MG Tabs  Take 1 tablet by mouth daily.     prochlorperazine 10 MG tablet  Commonly known as:  COMPAZINE  Take 10 mg by mouth every 6 (six) hours as needed for nausea or vomiting. Reported on 12/31/2015     promethazine 25 MG tablet  Commonly known as:  PHENERGAN  Take 1 tablet (25 mg total) by mouth every 6 (six) hours as needed for nausea or vomiting.           Follow-up Information    Follow up with [facility] In 1 week.   Specialty:  Perinatology   Contact information:   [id number] [place] [phone]      Signed: Dorthula Nettles 12/31/2015, 10:08 AM

## 2015-12-31 NOTE — OB Triage Note (Signed)
Pt arrived to OBS rm 3 from ED and placed on monitors. Pt here with c/o of emesis for the past 4 days with no relief.

## 2016-02-16 ENCOUNTER — Emergency Department
Admission: EM | Admit: 2016-02-16 | Discharge: 2016-02-16 | Disposition: A | Discharge status: Home / Self Care | Payer: Medicaid Other | Attending: Emergency Medicine | Admitting: Emergency Medicine

## 2016-02-16 DIAGNOSIS — Z87891 Personal history of nicotine dependence: Secondary | ICD-10-CM | POA: Diagnosis not present

## 2016-02-16 DIAGNOSIS — K122 Cellulitis and abscess of mouth: Secondary | ICD-10-CM | POA: Insufficient documentation

## 2016-02-16 DIAGNOSIS — Z79899 Other long term (current) drug therapy: Secondary | ICD-10-CM | POA: Insufficient documentation

## 2016-02-16 DIAGNOSIS — O99613 Diseases of the digestive system complicating pregnancy, third trimester: Secondary | ICD-10-CM | POA: Insufficient documentation

## 2016-02-16 DIAGNOSIS — Z3A28 28 weeks gestation of pregnancy: Secondary | ICD-10-CM | POA: Insufficient documentation

## 2016-02-16 MED ORDER — AMOXICILLIN 500 MG PO TABS: 500.0000 mg | ORAL_TABLET | Freq: Three times a day (TID) | ORAL | Status: DC

## 2016-02-16 NOTE — ED Notes (Signed)
Pt with possible dental abscess

## 2016-02-16 NOTE — ED Notes (Signed)
Pt arrived to ED with c/o pain in left upper back tooth and gum. Pt c/o pain and swelling.

## 2016-02-16 NOTE — Discharge Instructions (Signed)
Please make an appointment with a dentist for follow up within 7-10 days.   May take Tylenol as needed for pain.   Abscess An abscess is an infected area that contains a collection of pus and debris.It can occur in almost any part of the body. An abscess is also known as a furuncle or boil. CAUSES  An abscess occurs when tissue gets infected. This can occur from blockage of oil or sweat glands, infection of hair follicles, or a minor injury to the skin. As the body tries to fight the infection, pus collects in the area and creates pressure under the skin. This pressure causes pain. People with weakened immune systems have difficulty fighting infections and get certain abscesses more often.  SYMPTOMS Usually an abscess develops on the skin and becomes a painful mass that is red, warm, and tender. If the abscess forms under the skin, you may feel a moveable soft area under the skin. Some abscesses break open (rupture) on their own, but most will continue to get worse without care. The infection can spread deeper into the body and eventually into the bloodstream, causing you to feel ill.  DIAGNOSIS  Your caregiver will take your medical history and perform a physical exam. A sample of fluid may also be taken from the abscess to determine what is causing your infection. TREATMENT  Your caregiver may prescribe antibiotic medicines to fight the infection. However, taking antibiotics alone usually does not cure an abscess. Your caregiver may need to make a small cut (incision) in the abscess to drain the pus. In some cases, gauze is packed into the abscess to reduce pain and to continue draining the area. HOME CARE INSTRUCTIONS   Only take over-the-counter or prescription medicines for pain, discomfort, or fever as directed by your caregiver.  If you were prescribed antibiotics, take them as directed. Finish them even if you start to feel better.  If gauze is used, follow your caregiver's directions  for changing the gauze.  To avoid spreading the infection:  Keep your draining abscess covered with a bandage.  Wash your hands well.  Do not share personal care items, towels, or whirlpools with others.  Avoid skin contact with others.  Keep your skin and clothes clean around the abscess.  Keep all follow-up appointments as directed by your caregiver. SEEK MEDICAL CARE IF:   You have increased pain, swelling, redness, fluid drainage, or bleeding.  You have muscle aches, chills, or a general ill feeling.  You have a fever. MAKE SURE YOU:   Understand these instructions.  Will watch your condition.  Will get help right away if you are not doing well or get worse.   This information is not intended to replace advice given to you by your health care provider. Make sure you discuss any questions you have with your health care provider.   Document Released: 09/20/2005 Document Revised: 06/11/2012 Document Reviewed: 02/23/2012 Elsevier Interactive Patient Education [employer]

## 2016-02-16 NOTE — ED Provider Notes (Signed)
[facility] Emergency Department Provider Note  ____________________________________________  Time seen: Approximately 9:50 PM  I have reviewed the triage vital signs and the nursing notes.   HISTORY  Chief Complaint Dental Pain    HPI Nichole Krause is a 30 y.o. female , NAD, presents to the emergency department with one-day history of left upper gum swelling and erythema. States tooth in that area had a cap on it but it fell off in the remote past. Has had pain and swelling in the area for the last day or so. Has not had any oozing or weeping. Denies fever, chills, body aches. No trauma or injury to the face or mouth. She is [redacted] weeks pregnant with twins and notes earlier in the week she had vaccinations updated. Does have dental insurance and plans to schedule an appointment soon.   Past Medical History  Diagnosis Date  . BV (bacterial vaginosis)     Patient Active Problem List   Diagnosis Date Noted  . Vomiting 12/31/2015    History reviewed. No pertinent past surgical history.  Current Outpatient Rx  Name  Route  Sig  Dispense  Refill  . amoxicillin (AMOXIL) 500 MG tablet   Oral   Take 1 tablet (500 mg total) by mouth 3 (three) times daily with meals.   30 tablet   0   . Prenatal Vit-Fe Fumarate-FA (PRENATAL VITAMIN) 27-0.8 MG TABS   Oral   Take 1 tablet by mouth daily.         . prochlorperazine (COMPAZINE) 10 MG tablet   Oral   Take 10 mg by mouth every 6 (six) hours as needed for nausea or vomiting. Reported on 12/31/2015         . promethazine (PHENERGAN) 25 MG tablet   Oral   Take 1 tablet (25 mg total) by mouth every 6 (six) hours as needed for nausea or vomiting.   30 tablet   0     Allergies Review of patient's allergies indicates no known allergies.  History reviewed. No pertinent family history.  Social History Social History  Substance Use Topics  . Smoking status: Former Games developer  . Smokeless tobacco: Never Used   . Alcohol Use: No     Review of Systems Constitutional: No fever/chills ENT: Left upper gum line swelling and pain. No sore throat, mouth sores. Cardiovascular: No chest pain. Respiratory: No shortness of breath. No wheezing.  Gastrointestinal: No abdominal pain.  No nausea, vomiting.  Musculoskeletal: Negative for jaw pain.   Skin: Negative for rash. Neurological: Negative for headaches, focal weakness or numbness. 10-point ROS otherwise negative.  ____________________________________________   PHYSICAL EXAM:  VITAL SIGNS: ED Triage Vitals  Enc Vitals Group     BP 02/16/16 2117 119/76 mmHg     Pulse Rate 02/16/16 2117 99     Resp 02/16/16 2117 18     Temp 02/16/16 2117 98.2 F (36.8 C)     Temp Source 02/16/16 2117 Oral     SpO2 02/16/16 2117 100 %     Weight 02/16/16 2117 175 lb (79.379 kg)     Height 02/16/16 2117  (1.575 m)     Head Cir --      Peak Flow --      Pain Score 02/16/16 2118 8     Pain Loc --      Pain Edu? --      Excl. in GC? --     Constitutional: Alert and oriented. Well  appearing and in no acute distress. Eyes: Conjunctivae are normal. PERRL.  Head: Atraumatic. Mouth: Left upper anterior gum line with abscess about a broken molar. Mild erythema but no oozing or weeping. Neck: No stridor. Supple with full range of motion. Hematological/Lymphatic/Immunilogical: No cervical lymphadenopathy. Cardiovascular:   Good peripheral circulation. Respiratory: Normal respiratory effort without tachypnea or retractions.  Neurologic:  Normal speech and language. No gross focal neurologic deficits are appreciated.  Skin:  Skin is warm, dry and intact. No rash noted. Psychiatric: Mood and affect are normal. Speech and behavior are normal. Patient exhibits appropriate insight and judgement.   ____________________________________________    LABS  None  ____________________________________________  EKG  None ____________________________________________  RADIOLOGY  None ____________________________________________    PROCEDURES  Procedure(s) performed: None    Medications - No data to display   ____________________________________________   INITIAL IMPRESSION / ASSESSMENT AND PLAN / ED COURSE  Patient's diagnosis is consistent with abscess oral soft tissues. Patient will be discharged home with prescriptions for amoxicillin 500 mg to take one tablet by mouth 3 times daily with meals. Patient may take Tylenol as needed for pain. Patient is to follow up with a dentist covered by her dental insurance within 7-10 days. Patient is given ED precautions to return to the ED for any worsening or new symptoms.      ____________________________________________  FINAL CLINICAL IMPRESSION(S) / ED DIAGNOSES  Final diagnoses:  Cellulitis and abscess of oral soft tissues      NEW MEDICATIONS STARTED DURING THIS VISIT:  New Prescriptions   AMOXICILLIN (AMOXIL) 500 MG TABLET    Take 1 tablet (500 mg total) by mouth 3 (three) times daily with meals.         Hope Pigeon, PA-C 02/16/16 2216  Sharman Cheek, MD 02/18/16 [phone]

## 2016-02-16 NOTE — ED Notes (Signed)
Pt ambulatory to room 44 without difficulty or distress noted; st since yesterday having left upper toothache; tylenol taken at 4pm; pt 28wks twin pregnant

## 2016-09-19 ENCOUNTER — Emergency Department: Payer: Self-pay

## 2016-09-19 ENCOUNTER — Emergency Department
Admission: EM | Admit: 2016-09-19 | Discharge: 2016-09-19 | Disposition: A | Discharge status: Home / Self Care | Payer: Self-pay | Attending: Emergency Medicine | Admitting: Emergency Medicine

## 2016-09-19 DIAGNOSIS — Z87891 Personal history of nicotine dependence: Secondary | ICD-10-CM | POA: Insufficient documentation

## 2016-09-19 DIAGNOSIS — Z79899 Other long term (current) drug therapy: Secondary | ICD-10-CM | POA: Insufficient documentation

## 2016-09-19 DIAGNOSIS — Z3A01 Less than 8 weeks gestation of pregnancy: Secondary | ICD-10-CM | POA: Insufficient documentation

## 2016-09-19 DIAGNOSIS — O200 Threatened abortion: Secondary | ICD-10-CM | POA: Insufficient documentation

## 2016-09-19 LAB — WET PREP, GENITAL
Clue Cells Wet Prep HPF POC: NONE SEEN
Trich, Wet Prep: NONE SEEN
YEAST WET PREP: NONE SEEN

## 2016-09-19 LAB — BASIC METABOLIC PANEL
Anion gap: 5 (ref 5–15)
BUN: 11 mg/dL (ref 6–20)
CALCIUM: 8.9 mg/dL (ref 8.9–10.3)
CO2: 26 mmol/L (ref 22–32)
Chloride: 106 mmol/L (ref 101–111)
Creatinine, Ser: 0.86 mg/dL (ref 0.44–1.00)
GFR calc Af Amer: >60 mL/min (ref >60)
GLUCOSE: 106 mg/dL — AB (ref 65–99)
Potassium: 3.9 mmol/L (ref 3.5–5.1)
SODIUM: 137 mmol/L (ref 135–145)

## 2016-09-19 LAB — ABO/RH: ABO/RH(D): A POS

## 2016-09-19 LAB — HCG, QUANTITATIVE, PREGNANCY: hCG, Beta Chain, Quant, S: 34666 m[IU]/mL — ABNORMAL HIGH (ref <5)

## 2016-09-19 LAB — CHLAMYDIA/NGC RT PCR (ARMC ONLY)
Chlamydia Tr: NOT DETECTED
N GONORRHOEAE: NOT DETECTED

## 2016-09-19 LAB — CBC
HCT: 41.3 % (ref 35.0–47.0)
Hemoglobin: 14.6 g/dL (ref 12.0–16.0)
MCH: 29.7 pg (ref 26.0–34.0)
MCHC: 35.3 g/dL (ref 32.0–36.0)
MCV: 84.2 fL (ref 80.0–100.0)
PLATELETS: 174 10*3/uL (ref 150–440)
RBC: 4.91 MIL/uL (ref 3.80–5.20)
RDW: 13.9 % (ref 11.5–14.5)
WBC: 7.7 10*3/uL (ref 3.6–11.0)

## 2016-09-19 MED ORDER — ONDANSETRON 4 MG PO TBDP: 4.0000 mg | ORAL_TABLET | Freq: Three times a day (TID) | ORAL | 0 refills | Status: AC | PRN

## 2016-09-19 NOTE — ED Triage Notes (Signed)
Pt states she had a positive pregnancy test over a week ago and since has had brown bloody discharge with nausea, vomiting for the past 2 days.. Denies any pain.

## 2016-09-19 NOTE — ED Provider Notes (Signed)
[facility] Emergency Department Provider Note  Time seen: 9:13 AM  I have reviewed the triage vital signs and the nursing notes.   HISTORY  Chief Complaint Possible Pregnancy    HPI Nichole Krause is a 30 y.o. female G6 P5, who presents to the emergency department with nausea and vaginal bleeding. According to the patient for the past 2-3 weeks she has been feeling nauseated with occasional episodes of vomiting. She states she was late on her period so she took a pregnancy test 2 weeks ago which was positive. Patient states over the past 2 days she has been experiencing minimal brown vaginal discharge. Patient denies any abdominal pain or pelvic pain. Denies any pelvic discharge.  Past Medical History:  Diagnosis Date  . BV (bacterial vaginosis)     Patient Active Problem List   Diagnosis Date Noted  . Vomiting 12/31/2015    Past Surgical History:  Procedure Laterality Date  . CESAREAN SECTION      Prior to Admission medications   Medication Sig Start Date End Date Taking? Authorizing Provider  amoxicillin (AMOXIL) 500 MG tablet Take 1 tablet (500 mg total) by mouth 3 (three) times daily with meals. 02/16/16   Jami L Hagler, PA-C  Prenatal Vit-Fe Fumarate-FA (PRENATAL VITAMIN) 27-0.8 MG TABS Take 1 tablet by mouth daily.    Historical Provider, MD  prochlorperazine (COMPAZINE) 10 MG tablet Take 10 mg by mouth every 6 (six) hours as needed for nausea or vomiting. Reported on 12/31/2015    Historical Provider, MD  promethazine (PHENERGAN) 25 MG tablet Take 1 tablet (25 mg total) by mouth every 6 (six) hours as needed for nausea or vomiting. 12/31/15   Vena AustriaAndreas Staebler, MD    No Known Allergies  No family history on file.  Social History Social History  Substance Use Topics  . Smoking status: Former Games developermoker  . Smokeless tobacco: Never Used  . Alcohol use No    Review of Systems Constitutional: Negative for fever. Cardiovascular: Negative for chest  pain. Respiratory: Negative for shortness of breath. Gastrointestinal: Negative for abdominal pain Genitourinary: Negative for dysuria.Positive for vaginal bleeding Neurological: Negative for headache 10-point ROS otherwise negative.  ____________________________________________   PHYSICAL EXAM:  VITAL SIGNS: ED Triage Vitals [09/19/16 0839]  Enc Vitals Group     BP 114/65     Pulse Rate 74     Resp 16     Temp 97.9 F (36.6 C)     Temp Source Oral     SpO2 100 %     Weight 157 lb (71.2 kg)     Height 5\' 2"  (1.575 m)     Head Circumference      Peak Flow      Pain Score      Pain Loc      Pain Edu?      Excl. in GC?     Constitutional: Alert and oriented. Well appearing and in no distress. Eyes: Normal exam ENT   Head: Normocephalic and atraumatic   Mouth/Throat: Mucous membranes are moist. Cardiovascular: Normal rate, regular rhythm. No murmur Respiratory: Normal respiratory effort without tachypnea nor retractions. Breath sounds are clear Gastrointestinal: Soft and nontender. No distention.   Musculoskeletal: Nontender with normal range of motion in all extremities. Neurologic:  Normal speech and language. No gross focal neurologic deficits Skin:  Skin is warm, dry and intact.  Psychiatric: Mood and affect are normal.   ____________________________________________   RADIOLOGY  Single live IUP at 6  weeks.  ____________________________________________   INITIAL IMPRESSION / ASSESSMENT AND PLAN / ED COURSE  Pertinent labs & imaging results that were available during my care of the patient were reviewed by me and considered in my medical decision making (see chart for details).  Well-appearing patient who is possibly around [redacted] weeks pregnant, last period was sometime in August per patient. Presents with 2 days of vaginal bleeding, threatened miscarriage. Denies any vaginal discharge abdominal pain or pelvic pain. On pelvic examination the patient has a  very small amount of brownish discharge consistent with very minimal bleeding. No abdominal pain. Closed appearing cervix, no adnexal or cervical motion tenderness. We will proceed with labs, and an ultrasound to further evaluate. Overall the patient appears very well at this time.  Labs are within normal limits. Single live IUP at 6 weeks. We will discharge with OB follow-up.  ____________________________________________   FINAL CLINICAL IMPRESSION(S) / ED DIAGNOSES  Threatened miscarriage    Minna Antis, MD 09/19/16 1104

## 2016-10-07 ENCOUNTER — Encounter: Payer: Self-pay | Admitting: Emergency Medicine

## 2016-10-07 ENCOUNTER — Emergency Department
Admission: EM | Admit: 2016-10-07 | Discharge: 2016-10-07 | Disposition: A | Discharge status: Home / Self Care | Payer: Medicaid Other | Attending: Emergency Medicine | Admitting: Emergency Medicine

## 2016-10-07 DIAGNOSIS — O210 Mild hyperemesis gravidarum: Secondary | ICD-10-CM | POA: Diagnosis not present

## 2016-10-07 DIAGNOSIS — Z3A09 9 weeks gestation of pregnancy: Secondary | ICD-10-CM | POA: Diagnosis not present

## 2016-10-07 DIAGNOSIS — Z87891 Personal history of nicotine dependence: Secondary | ICD-10-CM | POA: Insufficient documentation

## 2016-10-07 LAB — COMPREHENSIVE METABOLIC PANEL
ALBUMIN: 4.2 g/dL (ref 3.5–5.0)
ALK PHOS: 54 U/L (ref 38–126)
ALT: 11 U/L — ABNORMAL LOW (ref 14–54)
ANION GAP: 7 (ref 5–15)
AST: 22 U/L (ref 15–41)
BILIRUBIN TOTAL: 0.9 mg/dL (ref 0.3–1.2)
BUN: 10 mg/dL (ref 6–20)
CALCIUM: 9.2 mg/dL (ref 8.9–10.3)
CO2: 27 mmol/L (ref 22–32)
Chloride: 102 mmol/L (ref 101–111)
Creatinine, Ser: 0.78 mg/dL (ref 0.44–1.00)
Glucose, Bld: 93 mg/dL (ref 65–99)
POTASSIUM: 4.1 mmol/L (ref 3.5–5.1)
Sodium: 136 mmol/L (ref 135–145)
TOTAL PROTEIN: 7.8 g/dL (ref 6.5–8.1)

## 2016-10-07 LAB — BETA-HYDROXYBUTYRIC ACID: Beta-Hydroxybutyric Acid: 0.17 mmol/L (ref 0.05–0.27)

## 2016-10-07 LAB — HEMOGLOBIN AND HEMATOCRIT, BLOOD
HEMATOCRIT: 46.2 % (ref 35.0–47.0)
HEMOGLOBIN: 16.2 g/dL — AB (ref 12.0–16.0)

## 2016-10-07 LAB — HCG, QUANTITATIVE, PREGNANCY: hCG, Beta Chain, Quant, S: 123988 m[IU]/mL — ABNORMAL HIGH (ref <5)

## 2016-10-07 LAB — LIPASE, BLOOD: LIPASE: 30 U/L (ref 11–51)

## 2016-10-07 LAB — MAGNESIUM: MAGNESIUM: 2.1 mg/dL (ref 1.7–2.4)

## 2016-10-07 MED ORDER — SODIUM CHLORIDE 0.9 % IV BOLUS (SEPSIS)
1000.0000 mL | Freq: Once | INTRAVENOUS | Status: AC
  Administered 2016-10-07: 1000 mL via INTRAVENOUS

## 2016-10-07 MED ORDER — PROMETHAZINE HCL 25 MG PO TABS
25.0000 mg | ORAL_TABLET | ORAL | Status: AC
  Administered 2016-10-07: 25 mg via ORAL
  Filled 2016-10-07: qty 1

## 2016-10-07 MED ORDER — PROMETHAZINE HCL 25 MG PO TABS: 25.0000 mg | ORAL_TABLET | Freq: Three times a day (TID) | ORAL | 0 refills | Status: DC | PRN

## 2016-10-07 NOTE — ED Triage Notes (Signed)
[redacted] weeks pregnant, history nausea and vomiting with pregnancy, unable to keep anything down x 2 days.

## 2016-10-07 NOTE — ED Provider Notes (Signed)
[facility] Emergency Department Provider Note   ____________________________________________   First MD Initiated Contact with Patient 10/07/16 1050     (approximate)  I have reviewed the triage vital signs and the nursing notes.   HISTORY  Chief Complaint Morning Sickness    HPI Nichole Krause is a 30 y.o. female the history of morning sickness with previous pregnancies. Also reports that she had a slight amount of vaginal bleeding early in this pregnancy, but this went away.  She is presently about [redacted] weeks pregnant. She has had 5 other children, all normal vaginal deliveries.  Throughout this entire pregnancy she's been having nausea in the morning. She reports that she has tried multiple different nausea medicines including diclegis, Zofran and the only medicine is helped in the past with morning sickness has been Phenergan.  She was able to eat a small hamburger yesterday, but feels very nauseated and throws up frequently for the last several weeks. No fevers or chills. Not in any pain. No abdominal pain or ongoing vaginal discomfort or bleeding.  She is able to eat a hamburger at [employer] yesterday, but again this morning felt nauseated and did not want to eat. She's been working hard to keep up with fluids  Past Medical History:  Diagnosis Date  . BV (bacterial vaginosis)     Patient Active Problem List   Diagnosis Date Noted  . Vomiting 12/31/2015    Past Surgical History:  Procedure Laterality Date  . CESAREAN SECTION      Prior to Admission medications   Medication Sig Start Date End Date Taking? Authorizing Provider  amoxicillin (AMOXIL) 500 MG tablet Take 1 tablet (500 mg total) by mouth 3 (three) times daily with meals. 02/16/16   Jami L Hagler, PA-C  ondansetron (ZOFRAN ODT) 4 MG disintegrating tablet Take 1 tablet (4 mg total) by mouth every 8 (eight) hours as needed for nausea or vomiting. 09/19/16   Minna Antis, MD    Prenatal Vit-Fe Fumarate-FA (PRENATAL VITAMIN) 27-0.8 MG TABS Take 1 tablet by mouth daily.    Historical Provider, MD  promethazine (PHENERGAN) 25 MG tablet Take 1 tablet (25 mg total) by mouth every 8 (eight) hours as needed for nausea or vomiting. 10/07/16   Sharyn Creamer, MD  Reports not currently taking any medication  Allergies Review of patient's allergies indicates no known allergies.  No family history on file.  Social History Social History  Substance Use Topics  . Smoking status: Former Games developer  . Smokeless tobacco: Never Used  . Alcohol use No    Review of Systems Constitutional: No fever/chills Eyes: No visual changes. ENT: No sore throat. Cardiovascular: Denies chest pain. Respiratory: Denies shortness of breath. Gastrointestinal: No abdominal pain.  No bloody vomitus  No diarrhea.  No constipation. Genitourinary: Negative for dysuria. Musculoskeletal: Negative for back pain. Skin: Negative for rash. Neurological: Negative for headaches, focal weakness or numbness.  10-point ROS otherwise negative.  ____________________________________________   PHYSICAL EXAM:  VITAL SIGNS: ED Triage Vitals  Enc Vitals Group     BP 10/07/16 1011 117/65     Pulse Rate 10/07/16 1011 72     Resp 10/07/16 1011 18     Temp 10/07/16 1011 98.1 F (36.7 C)     Temp Source 10/07/16 1011 Oral     SpO2 10/07/16 1011 99 %     Weight 10/07/16 1012 157 lb (71.2 kg)     Height 10/07/16 1012 5\' 2"  (1.575 m)  Head Circumference --      Peak Flow --      Pain Score 10/07/16 1324 0     Pain Loc --      Pain Edu? --      Excl. in GC? --     Constitutional: Alert and oriented. Well appearing and in no acute distress.She and her husband are both very pleasant, here with 4 other children Eyes: Conjunctivae are normal. PERRL. EOMI. Head: Atraumatic. Nose: No congestion/rhinnorhea. Mouth/Throat: Mucous membranes are moist.  Oropharynx non-erythematous. Neck: No stridor.    Cardiovascular: Normal rate, regular rhythm. Grossly normal heart sounds.  Good peripheral circulation. Respiratory: Normal respiratory effort.  No retractions. Lungs CTAB. Gastrointestinal: Soft and nontender. No distention. Not obviously gravid Musculoskeletal: No lower extremity tenderness nor edema.  No joint effusions. Neurologic:  Normal speech and language. No gross focal neurologic deficits are appreciated. No gait instability. Skin:  Skin is warm, dry and intact. No rash noted. Psychiatric: Mood and affect are normal. Speech and behavior are normal.  ____________________________________________   LABS (all labs ordered are listed, but only abnormal results are displayed)  Labs Reviewed  COMPREHENSIVE METABOLIC PANEL - Abnormal; Notable for the following:       Result Value   ALT 11 (*)    All other components within normal limits  HEMOGLOBIN AND HEMATOCRIT, BLOOD - Abnormal; Notable for the following:    Hemoglobin 16.2 (*)    All other components within normal limits  HCG, QUANTITATIVE, PREGNANCY - Abnormal; Notable for the following:    hCG, Beta Chain, Quant, S [id number] (*)    All other components within normal limits  MAGNESIUM  LIPASE, BLOOD  BETA-HYDROXYBUTYRIC ACID   ____________________________________________  EKG   ____________________________________________  RADIOLOGY  Patient had a previous ultrasound performed here confirming a positive intrauterine pregnancy at her last ER visit, I reviewed that ultrasound report ____________________________________________   PROCEDURES  Procedure(s) performed: None  Procedures  Critical Care performed: No  ____________________________________________   INITIAL IMPRESSION / ASSESSMENT AND PLAN / ED COURSE  Pertinent labs & imaging results that were available during my care of the patient were reviewed by me and considered in my medical decision making (see chart for details).  Patient has for nausea  and vomiting throughout her early pregnancy. Hemodynamically stable, and in no distress. Discussed with the patient she would like to try Phenergan by mouth. After giving the cyst was effective, she is able to keep down a [employer] soda and french fries. She is feeling much improved. We discussed relative risks and benefits of Phenergan as well as other nausea medications during early pregnancy, and she assures me she's tried multiple other medicines and Phenergan is the only medication has been truly effective for her. Thus we will prescribe that, she'll be following up closely with obstetrics and gynecology and provided information for follow-up. Careful return precautions advised.  Clinical Course     ____________________________________________   FINAL CLINICAL IMPRESSION(S) / ED DIAGNOSES  Final diagnoses:  Hyperemesis gravidarum      NEW MEDICATIONS STARTED DURING THIS VISIT:  Discharge Medication List as of 10/07/2016  1:14 PM       Note:  This document was prepared using Dragon voice recognition software and may include unintentional dictation errors.     Sharyn CreamerMark Quale, MD 10/07/16 [phone]

## 2016-10-07 NOTE — ED Notes (Signed)
Pt states understanding of discharge instructions. NAD noted at this time.  

## 2016-10-07 NOTE — Discharge Instructions (Signed)
Please follow up closely with obstetrics and gynecology or your primary doctor.  Return to the emergency room if have vaginabl bleeding, you become weak and dizzy or lightheaded, you have an episode of passing out, develop severe bleeding such as more than 1 soaked pad per hour for more than 3 straight hours, develop abdominal or pelvic pain, fevers chills or other new concerns arise.

## 2016-10-22 ENCOUNTER — Encounter: Payer: Self-pay | Admitting: Emergency Medicine

## 2016-10-22 ENCOUNTER — Emergency Department
Admission: EM | Admit: 2016-10-22 | Discharge: 2016-10-22 | Disposition: A | Discharge status: Home / Self Care | Payer: Medicaid Other | Attending: Emergency Medicine | Admitting: Emergency Medicine

## 2016-10-22 DIAGNOSIS — Z3A12 12 weeks gestation of pregnancy: Secondary | ICD-10-CM | POA: Insufficient documentation

## 2016-10-22 DIAGNOSIS — Z87891 Personal history of nicotine dependence: Secondary | ICD-10-CM | POA: Diagnosis not present

## 2016-10-22 DIAGNOSIS — O219 Vomiting of pregnancy, unspecified: Secondary | ICD-10-CM | POA: Diagnosis not present

## 2016-10-22 DIAGNOSIS — Z760 Encounter for issue of repeat prescription: Secondary | ICD-10-CM | POA: Diagnosis present

## 2016-10-22 MED ORDER — PROMETHAZINE HCL 25 MG PO TABS: 25.0000 mg | ORAL_TABLET | Freq: Three times a day (TID) | ORAL | 0 refills | Status: AC | PRN

## 2016-10-22 NOTE — ED Notes (Signed)
Pt states she has run out of her prescription of phenergan for nausea during her pregnancy. Pt states she is around 12-[redacted] weeks pregnant. Taking phenergan 25mg  Q8 hours. Pt last vomited last night. [facility].  Pt states she has nausea all throughout her pregnancy and did with her last pregnancy of twins.

## 2016-10-22 NOTE — Discharge Instructions (Signed)
Stay hydrated as we have already discussed. Take Phenergan as directed. Keep your appointment with [place]  [place].

## 2016-10-22 NOTE — ED Triage Notes (Signed)
FIRST NURSE NOTE:   C/O nausea with pregnancy.  Patient has first OB appointment scheduled for next week.  Instructed to come to ED for refill on phenergan RX until seen by OB.

## 2016-10-22 NOTE — ED Triage Notes (Signed)
Ran out her phenergan for morning sickness and her obgyn told her to come to er for refill

## 2016-10-22 NOTE — ED Provider Notes (Signed)
[facility] Emergency Department Provider Note  ___________________________________________   First MD Initiated Contact with Patient 10/22/16 1158     (approximate)  I have reviewed the triage vital signs and the nursing notes.   HISTORY  Chief Complaint Medication Refill   HPI Nichole Krause is a 30 y.o. female is here to get a refill of her Phenergan. Patient is 3 months pregnant and states that with her pregnancies in the past she has always had stay on Phenergan every 8 hours or she experiences hyperemesis. Patient states she last vomited last evening. She has been out of medication for little over 24 hours. [facility]. Patient states she has had nausea this morning but no vomiting. She denies any fever or chills. [facility] who told her to come to the emergency room for medication.   Past Medical History:  Diagnosis Date  . BV (bacterial vaginosis)     Patient Active Problem List   Diagnosis Date Noted  . Vomiting 12/31/2015    Past Surgical History:  Procedure Laterality Date  . CESAREAN SECTION      Prior to Admission medications   Medication Sig Start Date End Date Taking? Authorizing Provider  amoxicillin (AMOXIL) 500 MG tablet Take 1 tablet (500 mg total) by mouth 3 (three) times daily with meals. 02/16/16   Jami L Hagler, PA-C  ondansetron (ZOFRAN ODT) 4 MG disintegrating tablet Take 1 tablet (4 mg total) by mouth every 8 (eight) hours as needed for nausea or vomiting. 09/19/16   Minna AntisKevin Paduchowski, MD  Prenatal Vit-Fe Fumarate-FA (PRENATAL VITAMIN) 27-0.8 MG TABS Take 1 tablet by mouth daily.    Historical Provider, MD  promethazine (PHENERGAN) 25 MG tablet Take 1 tablet (25 mg total) by mouth every 8 (eight) hours as needed for nausea or vomiting. 10/22/16   Tommi Rumpshonda L Summers, PA-C    Allergies Review of patient's allergies indicates no known  allergies.  History reviewed. No pertinent family history.  Social History Social History  Substance Use Topics  . Smoking status: Former Games developermoker  . Smokeless tobacco: Never Used  . Alcohol use No    Review of Systems Constitutional: No fever/chills ENT: No sore throat. Cardiovascular: Denies chest pain. Respiratory: Denies shortness of breath. Gastrointestinal: No abdominal pain.  Positive nausea, positive vomiting.   Genitourinary: Negative for dysuria. Musculoskeletal: Negative for back pain. Skin: Negative for rash. Neurological: Negative for headaches, focal weakness or numbness.  10-point ROS otherwise negative.  ____________________________________________   PHYSICAL EXAM:  VITAL SIGNS: ED Triage Vitals  Enc Vitals Group     BP 10/22/16 1129 114/76     Pulse Rate 10/22/16 1129 85     Resp 10/22/16 1129 16     Temp 10/22/16 1129 98.3 F (36.8 C)     Temp Source 10/22/16 1129 Oral     SpO2 10/22/16 1129 99 %     Weight 10/22/16 1131 157 lb (71.2 kg)     Height 10/22/16 1131 5\' 2"  (1.575 m)     Head Circumference --      Peak Flow --      Pain Score 10/22/16 1131 0     Pain Loc --      Pain Edu? --      Excl. in GC? --     Constitutional: Alert and oriented. Well appearing and in no acute distress. Eyes: Conjunctivae are normal. PERRL.  EOMI. Head: Atraumatic. Nose: No congestion/rhinnorhea. Mouth/Throat: Mucous membranes are moist.  Oropharynx non-erythematous. Neck: No stridor.   Hematological/Lymphatic/Immunilogical: No cervical lymphadenopathy. Cardiovascular: Normal rate, regular rhythm. Grossly normal heart sounds.  Good peripheral circulation. Respiratory: Normal respiratory effort.  No retractions. Lungs CTAB. Gastrointestinal: Soft and nontender. No distention.  Musculoskeletal: Moves upper and lower extremities without difficulty.  Normal gait. Neurologic:  Normal speech and language. No gross focal neurologic deficits are appreciated. No gait  instability. Skin:  Skin is warm, dry and intact. No rash noted. Psychiatric: Mood and affect are normal. Speech and behavior are normal.  ____________________________________________   LABS (all labs ordered are listed, but only abnormal results are displayed)  Labs Reviewed - No data to display  PROCEDURES  Procedure(s) performed: None  Procedures  Critical Care performed: No  ____________________________________________   INITIAL IMPRESSION / ASSESSMENT AND PLAN / ED COURSE  Pertinent labs & imaging results that were available during my care of the patient were reviewed by me and considered in my medical decision making (see chart for details).    Clinical Course  Patient was given a refill on her Phenergan. She is encouraged to [facility]   ____________________________________________   FINAL CLINICAL IMPRESSION(S) / ED DIAGNOSES  Final diagnoses:  Nausea and vomiting in pregnancy prior to [redacted] weeks gestation      NEW MEDICATIONS STARTED DURING THIS VISIT:  Discharge Medication List as of 10/22/2016 12:10 PM       Note:  This document was prepared using Dragon voice recognition software and may include unintentional dictation errors.    Tommi Rumpshonda L Summers, PA-C 10/22/16 1438    Governor Rooksebecca Lord, MD 10/22/16 1515

## 2018-07-27 ENCOUNTER — Emergency Department: Payer: Self-pay

## 2018-07-27 ENCOUNTER — Encounter: Payer: Self-pay | Admitting: Emergency Medicine

## 2018-07-27 ENCOUNTER — Other Ambulatory Visit: Payer: Self-pay

## 2018-07-27 ENCOUNTER — Emergency Department
Admission: EM | Admit: 2018-07-27 | Discharge: 2018-07-27 | Disposition: A | Discharge status: Home / Self Care | Payer: Self-pay | Attending: Emergency Medicine | Admitting: Emergency Medicine

## 2018-07-27 DIAGNOSIS — Z3A01 Less than 8 weeks gestation of pregnancy: Secondary | ICD-10-CM | POA: Insufficient documentation

## 2018-07-27 DIAGNOSIS — R110 Nausea: Secondary | ICD-10-CM | POA: Insufficient documentation

## 2018-07-27 DIAGNOSIS — Z3201 Encounter for pregnancy test, result positive: Secondary | ICD-10-CM | POA: Insufficient documentation

## 2018-07-27 DIAGNOSIS — O26891 Other specified pregnancy related conditions, first trimester: Secondary | ICD-10-CM | POA: Insufficient documentation

## 2018-07-27 DIAGNOSIS — Z3491 Encounter for supervision of normal pregnancy, unspecified, first trimester: Secondary | ICD-10-CM | POA: Insufficient documentation

## 2018-07-27 DIAGNOSIS — R52 Pain, unspecified: Secondary | ICD-10-CM

## 2018-07-27 DIAGNOSIS — M545 Low back pain, unspecified: Secondary | ICD-10-CM

## 2018-07-27 DIAGNOSIS — Z87891 Personal history of nicotine dependence: Secondary | ICD-10-CM | POA: Insufficient documentation

## 2018-07-27 LAB — URINALYSIS, COMPLETE (UACMP) WITH MICROSCOPIC
Bilirubin Urine: NEGATIVE
Glucose, UA: NEGATIVE mg/dL
Ketones, ur: NEGATIVE mg/dL
Leukocytes, UA: NEGATIVE
NITRITE: NEGATIVE
PH: 5 (ref 5.0–8.0)
Protein, ur: NEGATIVE mg/dL
SPECIFIC GRAVITY, URINE: 1.019 (ref 1.005–1.030)

## 2018-07-27 LAB — CBC
HCT: 41.8 % (ref 35.0–47.0)
HEMOGLOBIN: 14.7 g/dL (ref 12.0–16.0)
MCH: 30.6 pg (ref 26.0–34.0)
MCHC: 35.2 g/dL (ref 32.0–36.0)
MCV: 87 fL (ref 80.0–100.0)
PLATELETS: 173 10*3/uL (ref 150–440)
RBC: 4.81 MIL/uL (ref 3.80–5.20)
RDW: 13.2 % (ref 11.5–14.5)
WBC: 6.9 10*3/uL (ref 3.6–11.0)

## 2018-07-27 LAB — COMPREHENSIVE METABOLIC PANEL
ALBUMIN: 4.4 g/dL (ref 3.5–5.0)
ALK PHOS: 67 U/L (ref 38–126)
ALT: 11 U/L (ref 0–44)
ANION GAP: 7 (ref 5–15)
AST: 22 U/L (ref 15–41)
BUN: 13 mg/dL (ref 6–20)
CALCIUM: 8.9 mg/dL (ref 8.9–10.3)
CO2: 24 mmol/L (ref 22–32)
CREATININE: 0.84 mg/dL (ref 0.44–1.00)
Chloride: 109 mmol/L (ref 98–111)
GFR calc Af Amer: >60 mL/min (ref >60)
GFR calc non Af Amer: >60 mL/min (ref >60)
GLUCOSE: 102 mg/dL — AB (ref 70–99)
Potassium: 4.1 mmol/L (ref 3.5–5.1)
Sodium: 140 mmol/L (ref 135–145)
TOTAL PROTEIN: 7.3 g/dL (ref 6.5–8.1)
Total Bilirubin: 0.9 mg/dL (ref 0.3–1.2)

## 2018-07-27 LAB — HCG, QUANTITATIVE, PREGNANCY: hCG, Beta Chain, Quant, S: 1269 m[IU]/mL — ABNORMAL HIGH (ref <5)

## 2018-07-27 MED ORDER — ONDANSETRON HCL 4 MG/2ML IJ SOLN
4.0000 mg | Freq: Once | INTRAMUSCULAR | Status: AC
  Administered 2018-07-27: 4 mg via INTRAVENOUS
  Filled 2018-07-27: qty 2

## 2018-07-27 NOTE — ED Triage Notes (Signed)
L flank pain and nausea without vomiting x 2 days. Denies dysuria.

## 2018-07-27 NOTE — ED Provider Notes (Signed)
[facility] Emergency Department Provider Note   ____________________________________________   First MD Initiated Contact with Patient 07/27/18 1116     (approximate)  I have reviewed the triage vital signs and the nursing notes.   HISTORY  Chief Complaint Flank Pain and Nausea    HPI Nichole Krause is a 32 y.o. 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  She reports that when she works she twists and lifts things up and thinks she may have pulled a muscle in her back.  Her symptoms she gets a sharp discomfort in the left lower portion of the back.  It does not radiate, does not cause any abdominal pain.  When the pain flares up she does get some nausea with it, but reports she slept well last night and she has been using Tylenol with success in treating the discomfort.  Patient reports that she has no pain in her stomach or abdomen.  The pain is mostly seated in her left lower back.  It does not radiate, is fairly comfortable at rest but when she bends or twists it is worsened.  Occasionally a slight sharp discomfort is felt in the left back.  She denies pain or burning with urination, no abnormal odor hazy urine.  Denies pregnancy, but does report she recently stopped her Implanon about a month and a half ago it was removed and she is not 100% certain she is not pregnant because her last menstrual cycle was a little over a month ago.  Denies any vaginal bleeding or vaginal symptoms.  Denies pelvic pain.  Past Medical History:  Diagnosis Date  . BV (bacterial vaginosis)     Patient Active Problem List   Diagnosis Date Noted  . Vomiting 12/31/2015    Past Surgical History:  Procedure Laterality Date  . CESAREAN SECTION      Prior to Admission medications   Medication Sig Start Date End Date Taking? Authorizing Provider  ondansetron  (ZOFRAN ODT) 4 MG disintegrating tablet Take 1 tablet (4 mg total) by mouth every 8 (eight) hours as needed for nausea or vomiting. Patient not taking: Reported on 07/27/2018 09/19/16   Minna AntisPaduchowski, Kevin, MD  promethazine (PHENERGAN) 25 MG tablet Take 1 tablet (25 mg total) by mouth every 8 (eight) hours as needed for nausea or vomiting. Patient not taking: Reported on 07/27/2018 10/22/16   Tommi RumpsSummers, Rhonda L, PA-C    Allergies Patient has no known allergies.  No family history on file.  Social History Social History   Tobacco Use  . Smoking status: Former Games developermoker  . Smokeless tobacco: Never Used  Substance Use Topics  . Alcohol use: No  . Drug use: No    Review of Systems Constitutional: No fever/chills Eyes: No visual changes. ENT: No sore throat. Cardiovascular: Denies chest pain. Respiratory: Denies shortness of breath. Gastrointestinal: No abdominal pain.  No nausea, no vomiting.  No diarrhea.  No constipation. Genitourinary: Negative for dysuria. Musculoskeletal: See HPI.  There was no fall or injury.  There is no weakness numbness or tingling in the legs.  No bowel or bladder changes. Skin: Negative for rash. Neurological: Negative for headaches, focal weakness or numbness.    ____________________________________________   PHYSICAL EXAM:  VITAL SIGNS: ED Triage Vitals  Enc Vitals Group     BP 07/27/18 1030 122/70     Pulse Rate 07/27/18  1030 69     Resp 07/27/18 1030 20     Temp 07/27/18 1030 98.1 F (36.7 C)     Temp Source 07/27/18 1030 Oral     SpO2 07/27/18 1030 100 %     Weight 07/27/18 1031 165 lb (74.8 kg)     Height 07/27/18 1031 5\' 2"  (1.575 m)     Head Circumference --      Peak Flow --      Pain Score 07/27/18 1030 6     Pain Loc --      Pain Edu? --      Excl. in GC? --     Constitutional: Alert and oriented. Well appearing and in no acute distress. Eyes: Conjunctivae are normal. Head: Atraumatic. Nose: No  congestion/rhinnorhea. Mouth/Throat: Mucous membranes are moist. Neck: No stridor.   Cardiovascular: Normal rate, regular rhythm. Grossly normal heart sounds.  Good peripheral circulation. Respiratory: Normal respiratory effort.  No retractions. Lungs CTAB. Gastrointestinal: Soft and nontender. No distention.  There is no CVA tenderness bilaterally. Musculoskeletal: No lower extremity tenderness nor edema.  Patient does report tenderness along the left lower mid to lower lumbar paraspinous region.  She does report discomfort when she bends forward or twists side to side.  She otherwise appears quite comfortable.  There is some focal tenderness over the lumbar paraspinous region.  No midline lumbar tenderness or deformity.  No overlying lesions. Neurologic:  Normal speech and language. No gross focal neurologic deficits are appreciated.  Skin:  Skin is warm, dry and intact. No rash noted. Psychiatric: Mood and affect are normal. Speech and behavior are normal.  ____________________________________________   LABS (all labs ordered are listed, but only abnormal results are displayed)  Labs Reviewed  COMPREHENSIVE METABOLIC PANEL - Abnormal; Notable for the following components:      Result Value   Glucose, Bld 102 (*)    All other components within normal limits  URINALYSIS, COMPLETE (UACMP) WITH MICROSCOPIC - Abnormal; Notable for the following components:   Color, Urine YELLOW (*)    APPearance HAZY (*)    Hgb urine dipstick SMALL (*)    Bacteria, UA MANY (*)    All other components within normal limits  HCG, QUANTITATIVE, PREGNANCY - Abnormal; Notable for the following components:   hCG, Beta Chain, Quant, S 1,269 (*)    All other components within normal limits  CBC   ____________________________________________  EKG   ____________________________________________  RADIOLOGY  Transvaginal ultrasound reviewed by me   IMPRESSION: Possible intrauterine gestational sac is  present. Embryo is not yet visualized. Follow-up ultrasound is recommended in 14 or more days to document presence of fetal pole and for dating purposes. Electronically Signed   By: Norva Pavlov M.D.   On: 07/27/2018 14:35    ____________________________________________   PROCEDURES  Procedure(s) performed: None  Procedures  Critical Care performed: No  ____________________________________________   INITIAL IMPRESSION / ASSESSMENT AND PLAN / ED COURSE  Pertinent labs & imaging results that were available during my care of the patient were reviewed by me and considered in my medical decision making (see chart for details).  Left back discomfort.  Sounds primarily musculoskeletal in nature, but given the patient's age and symptoms of location in the left lower back we would also consider evaluation for exclusion of UTI.  If sent for urine culture, denies infectious/urinary symptoms.  No associated pelvic or abdominal pain.  No abdominal discomfort on examination.  Did not appear to be an intra-abdominal  gynecologic or pelvic process.  Suspect most likely this is related to musculoskeletal discomfort as it is quite reproducible on examination as well.  We will check a renal ultrasound as well to evaluate for hydro-though I suspect likely musculoskeletal.  Clinical Course as of Jul 27 1499  Sat Jul 27, 2018  1345 HCG is positive. Had implanon removed in April, LMP was started June 22. Resting comfortably, no distress. Will obtain TVUS to evaluate for IUP and ectopic (low clinical suspicion ectopic based on exam at this time)   [MQ]  1350 HCG, Beta Chain, Quant, S(!): 1,269 [MQ]  1350 Bacteria, UA(!): MANY [MQ]  1350 Send culture. Denies urinary symptoms.   Squamous Epithelial / LPF: 11-20 [MQ]  1450 Patient resting comfortably.  Transvaginal ultrasound result reviewed, no evidence of ectopic and some evidence there may be an intrauterine pregnancy very early.  Patient resting  comfortably.  No abdominal discomfort or pain on reexam.  No bleeding or discharge.  Does not appear consistent with a ruptured ectopic, I suspect very low risk for ectopic.  She normally follows with [facility]ne[facility], would prefer to be able to follow-up in the [facility] area and I have paged [facility] Dr. Valentino Saxon to discuss case and see if he would be willing to follow her up on Monday or Tuesday.   [MQ]    Clinical Course User Index [MQ] Sharyn Creamer, MD   HCG is positive, only mildly.  She reports her last menstrual cycle began June 22, placing her at roughly 6 weeks of pregnancy.  Will obtain a transvaginal ultrasound to further evaluate.  Dr. Valentino Saxon recommends patient follow-up with our clinic about 1 week's time.  Culture of the urine discussed, as well as the patient's clinical symptoms and presentation.  Patient agreeable and will follow-up with either her OB at [facility] or with [facility], calling Monday to schedule close follow-up appointment.  Careful return precautions discussed with patient who is in agreement.  Return precautions and treatment recommendations and follow-up discussed with the patient who is agreeable with the plan.  ____________________________________________   FINAL CLINICAL IMPRESSION(S) / ED DIAGNOSES  Final diagnoses:  Acute left-sided low back pain without sciatica  Pregnancy test positive  First trimester pregnancy      NEW MEDICATIONS STARTED DURING THIS VISIT:  New Prescriptions   No medications on file     Note:  This document was prepared using Dragon voice recognition software and may include unintentional dictation errors.     Sharyn Creamer, MD 07/27/18 1501

## 2018-07-27 NOTE — Discharge Instructions (Addendum)
Please follow up closely [facility] or with Dr. Valentino Saxonherry at [facility] within 3 to 5 days for a repeat blood test HCG and reevaluation of your symptoms.  Return to the emergency room if you begin bleeding, you become weak and dizzy or lightheaded, you have an episode of passing out, develop severe bleeding such as more than 1 soaked pad per hour for more than 3 straight hours, develop abdominal or pelvic pain, fevers chills or other new concerns arise.

## 2018-08-06 ENCOUNTER — Encounter: Payer: Self-pay | Admitting: Obstetrics and Gynecology

## 2018-09-29 ENCOUNTER — Emergency Department
Admission: EM | Admit: 2018-09-29 | Discharge: 2018-09-29 | Disposition: A | Discharge status: Home / Self Care | Payer: Medicaid Other | Attending: Emergency Medicine | Admitting: Emergency Medicine

## 2018-09-29 DIAGNOSIS — Z3A14 14 weeks gestation of pregnancy: Secondary | ICD-10-CM | POA: Diagnosis not present

## 2018-09-29 DIAGNOSIS — O26892 Other specified pregnancy related conditions, second trimester: Secondary | ICD-10-CM | POA: Insufficient documentation

## 2018-09-29 DIAGNOSIS — O219 Vomiting of pregnancy, unspecified: Secondary | ICD-10-CM | POA: Insufficient documentation

## 2018-09-29 DIAGNOSIS — Z87891 Personal history of nicotine dependence: Secondary | ICD-10-CM | POA: Insufficient documentation

## 2018-09-29 DIAGNOSIS — R51 Headache: Secondary | ICD-10-CM | POA: Insufficient documentation

## 2018-09-29 LAB — CBC
HEMATOCRIT: 45.1 % (ref 35.0–47.0)
HEMOGLOBIN: 15.6 g/dL (ref 12.0–16.0)
MCH: 30.5 pg (ref 26.0–34.0)
MCHC: 34.7 g/dL (ref 32.0–36.0)
MCV: 87.9 fL (ref 80.0–100.0)
Platelets: 203 10*3/uL (ref 150–440)
RBC: 5.13 MIL/uL (ref 3.80–5.20)
RDW: 14 % (ref 11.5–14.5)
WBC: 11.1 10*3/uL — ABNORMAL HIGH (ref 3.6–11.0)

## 2018-09-29 LAB — LIPASE, BLOOD: Lipase: 25 U/L (ref 11–51)

## 2018-09-29 LAB — COMPREHENSIVE METABOLIC PANEL WITH GFR
ALT: 12 U/L (ref 0–44)
AST: 14 U/L — ABNORMAL LOW (ref 15–41)
Albumin: 3.9 g/dL (ref 3.5–5.0)
Alkaline Phosphatase: 60 U/L (ref 38–126)
Anion gap: 9 (ref 5–15)
BUN: 10 mg/dL (ref 6–20)
CO2: 22 mmol/L (ref 22–32)
Calcium: 8.9 mg/dL (ref 8.9–10.3)
Chloride: 105 mmol/L (ref 98–111)
Creatinine, Ser: 0.65 mg/dL (ref 0.44–1.00)
GFR calc Af Amer: >60 mL/min
GFR calc non Af Amer: >60 mL/min
Glucose, Bld: 99 mg/dL (ref 70–99)
Potassium: 3.6 mmol/L (ref 3.5–5.1)
Sodium: 136 mmol/L (ref 135–145)
Total Bilirubin: 0.8 mg/dL (ref 0.3–1.2)
Total Protein: 7.3 g/dL (ref 6.5–8.1)

## 2018-09-29 LAB — HCG, QUANTITATIVE, PREGNANCY: hCG, Beta Chain, Quant, S: 107826 m[IU]/mL — ABNORMAL HIGH (ref <5)

## 2018-09-29 MED ORDER — METOCLOPRAMIDE HCL 10 MG PO TABS: 10.0000 mg | ORAL_TABLET | Freq: Three times a day (TID) | ORAL | 1 refills | Status: AC | PRN

## 2018-09-29 MED ORDER — SODIUM CHLORIDE 0.9 % IV BOLUS
1000.0000 mL | Freq: Once | INTRAVENOUS | Status: AC
  Administered 2018-09-29: 1000 mL via INTRAVENOUS

## 2018-09-29 MED ORDER — METOCLOPRAMIDE HCL 5 MG/ML IJ SOLN
10.0000 mg | Freq: Once | INTRAMUSCULAR | Status: AC
  Administered 2018-09-29: 10 mg via INTRAVENOUS
  Filled 2018-09-29: qty 2

## 2018-09-29 NOTE — ED Triage Notes (Signed)
Pt presents via POV c/o headache and emesis since Friday. Reports unable to keep anything down. Denies abd pain. Reports 14 wks OB.

## 2018-09-29 NOTE — Discharge Instructions (Addendum)
Please seek medical attention for any high fevers, chest pain, shortness of breath, change in behavior, persistent vomiting, bloody stool or any other new or concerning symptoms.  

## 2018-09-29 NOTE — ED Provider Notes (Signed)
[facility] Emergency Department Provider Note  _________________________________________   I have reviewed the triage vital signs and the nursing notes.   HISTORY  Chief Complaint Emesis and Headache   History limited by: Not Limited   HPI Nichole Krause is a 32 y.o. female who presents to the emergency department today because of concerns for nausea vomiting and headache.  The patient states that she thinks she is around [redacted] weeks pregnant.  She has had to establish OB/GYN care for this.  She has been having some nausea throughout this pregnancy but states for the past 2 days it has been worse.  She has had multiple episodes of nausea and vomiting is unable to keep anything down.  This has been accompanied by headache.  She has been trying Phenergan without any relief.  She states that she has had bad nausea vomiting with her previous pregnancies.  She only complains of mild lower abdominal discomfort.  She denies any abnormal vaginal bleeding or discharge.  Per medical record review patient has a history of bv  Past Medical History:  Diagnosis Date  . BV (bacterial vaginosis)     Patient Active Problem List   Diagnosis Date Noted  . Vomiting 12/31/2015    Past Surgical History:  Procedure Laterality Date  . CESAREAN SECTION      Prior to Admission medications   Medication Sig Start Date End Date Taking? Authorizing Provider  ondansetron (ZOFRAN ODT) 4 MG disintegrating tablet Take 1 tablet (4 mg total) by mouth every 8 (eight) hours as needed for nausea or vomiting. Patient not taking: Reported on 07/27/2018 09/19/16   Minna Antis, MD  promethazine (PHENERGAN) 25 MG tablet Take 1 tablet (25 mg total) by mouth every 8 (eight) hours as needed for nausea or vomiting. Patient not taking: Reported on 07/27/2018 10/22/16   Tommi Rumps, PA-C    Allergies Patient has no known allergies.  History reviewed. No pertinent family  history.  Social History Social History   Tobacco Use  . Smoking status: Former Games developer  . Smokeless tobacco: Never Used  Substance Use Topics  . Alcohol use: No  . Drug use: No    Review of Systems Constitutional: No fever/chills Eyes: No visual changes. ENT: No sore throat. Cardiovascular: Denies chest pain. Respiratory: Denies shortness of breath. Gastrointestinal: Positive for nausea and vomiting.  Genitourinary: Negative for dysuria. Musculoskeletal: Negative for back pain. Skin: Negative for rash. Neurological: Positive for headache.  ____________________________________________   PHYSICAL EXAM:  VITAL SIGNS: ED Triage Vitals  Enc Vitals Group     BP 09/29/18 1643 116/79     Pulse Rate 09/29/18 1643 (!) 110     Resp 09/29/18 1643 14     Temp 09/29/18 1643 97.8 F (36.6 C)     Temp Source 09/29/18 1643 Oral     SpO2 09/29/18 1643 92 %     Weight 09/29/18 1644 168 lb (76.2 kg)     Height --      Head Circumference --      Peak Flow --      Pain Score 09/29/18 1644 0     Pain Loc --      Pain Edu? --      Excl. in GC? --      Constitutional: Alert and oriented.  Eyes: Conjunctivae are normal.  ENT      Head: Normocephalic and atraumatic.      Nose: No congestion/rhinnorhea.  Mouth/Throat: Mucous membranes are moist.      Neck: No stridor. Hematological/Lymphatic/Immunilogical: No cervical lymphadenopathy. Cardiovascular: Tachycardic, regular rhythm.  No murmurs, rubs, or gallops.  Respiratory: Normal respiratory effort without tachypnea nor retractions. Breath sounds are clear and equal bilaterally. No wheezes/rales/rhonchi. Gastrointestinal: Soft and non tender. No rebound. No guarding.  Genitourinary: Deferred Musculoskeletal: Normal range of motion in all extremities. No lower extremity edema. Neurologic:  Normal speech and language. No gross focal neurologic deficits are appreciated.  Skin:  Skin is warm, dry and intact. No rash  noted. Psychiatric: Mood and affect are normal. Speech and behavior are normal. Patient exhibits appropriate insight and judgment.  ____________________________________________    LABS (pertinent positives/negatives)  hcg 161,096 CMP wnl except ast 14 CBC wbc 11.1, hgb 15.6, plt 203 Lipase 25 ____________________________________________   EKG  None  ____________________________________________    RADIOLOGY  None  ____________________________________________   PROCEDURES  Procedures  ____________________________________________   INITIAL IMPRESSION / ASSESSMENT AND PLAN / ED COURSE  Pertinent labs & imaging results that were available during my care of the patient were reviewed by me and considered in my medical decision making (see chart for details).   Presented to the emergency department today because of concerns for nausea and vomiting in the setting of an early pregnancy.  Blood work without any concerning electrolyte abnormalities.  Patient did feel better after IV fluids and medication.  She was able to tolerate p.o.  No significant abdominal pain or tenderness.  Patient states she is taking prenatal vitamins and I encouraged continued use.  Patient states she has follow-up next week with OB/GYN.  ____________________________________________   FINAL CLINICAL IMPRESSION(S) / ED DIAGNOSES  Final diagnoses:  Vomiting pregnancy     Note: This dictation was prepared with Dragon dictation. Any transcriptional errors that result from this process are unintentional     Phineas Semen, MD 09/29/18 2044

## 2021-07-23 ENCOUNTER — Emergency Department
Admission: EM | Admit: 2021-07-23 | Discharge: 2021-07-23 | Disposition: A | Discharge status: Home / Self Care | Payer: Medicaid Other | Attending: Emergency Medicine | Admitting: Emergency Medicine

## 2021-07-23 ENCOUNTER — Emergency Department: Payer: Medicaid Other

## 2021-07-23 ENCOUNTER — Other Ambulatory Visit: Payer: Self-pay

## 2021-07-23 DIAGNOSIS — M546 Pain in thoracic spine: Secondary | ICD-10-CM | POA: Insufficient documentation

## 2021-07-23 DIAGNOSIS — R1012 Left upper quadrant pain: Secondary | ICD-10-CM | POA: Diagnosis present

## 2021-07-23 DIAGNOSIS — R000 Tachycardia, unspecified: Secondary | ICD-10-CM | POA: Diagnosis not present

## 2021-07-23 DIAGNOSIS — Z87891 Personal history of nicotine dependence: Secondary | ICD-10-CM | POA: Diagnosis not present

## 2021-07-23 DIAGNOSIS — Z8616 Personal history of COVID-19: Secondary | ICD-10-CM | POA: Insufficient documentation

## 2021-07-23 LAB — URINALYSIS, COMPLETE (UACMP) WITH MICROSCOPIC
Bilirubin Urine: NEGATIVE
Glucose, UA: NEGATIVE mg/dL
Hgb urine dipstick: NEGATIVE
Ketones, ur: 5 mg/dL — AB
Leukocytes,Ua: NEGATIVE
Nitrite: NEGATIVE
Protein, ur: NEGATIVE mg/dL
Specific Gravity, Urine: 1.018 (ref 1.005–1.030)
pH: 7 (ref 5.0–8.0)

## 2021-07-23 LAB — CBC
HCT: 41 % (ref 36.0–46.0)
Hemoglobin: 14.1 g/dL (ref 12.0–15.0)
MCH: 28.4 pg (ref 26.0–34.0)
MCHC: 34.4 g/dL (ref 30.0–36.0)
MCV: 82.7 fL (ref 80.0–100.0)
Platelets: 175 10*3/uL (ref 150–400)
RBC: 4.96 MIL/uL (ref 3.87–5.11)
RDW: 13.6 % (ref 11.5–15.5)
WBC: 10.5 10*3/uL (ref 4.0–10.5)
nRBC: 0 % (ref 0.0–0.2)

## 2021-07-23 LAB — COMPREHENSIVE METABOLIC PANEL
ALT: 11 U/L (ref 0–44)
AST: 14 U/L — ABNORMAL LOW (ref 15–41)
Albumin: 4.3 g/dL (ref 3.5–5.0)
Alkaline Phosphatase: 58 U/L (ref 38–126)
Anion gap: 7 (ref 5–15)
BUN: 14 mg/dL (ref 6–20)
CO2: 24 mmol/L (ref 22–32)
Calcium: 9.1 mg/dL (ref 8.9–10.3)
Chloride: 104 mmol/L (ref 98–111)
Creatinine, Ser: 0.75 mg/dL (ref 0.44–1.00)
GFR, Estimated: >60 mL/min (ref >60)
Glucose, Bld: 104 mg/dL — ABNORMAL HIGH (ref 70–99)
Potassium: 3.9 mmol/L (ref 3.5–5.1)
Sodium: 135 mmol/L (ref 135–145)
Total Bilirubin: 1 mg/dL (ref 0.3–1.2)
Total Protein: 7.4 g/dL (ref 6.5–8.1)

## 2021-07-23 LAB — POC URINE PREG, ED: Preg Test, Ur: NEGATIVE

## 2021-07-23 LAB — D-DIMER, QUANTITATIVE: D-Dimer, Quant: 1.18 ug/mL-FEU — ABNORMAL HIGH (ref 0.00–0.50)

## 2021-07-23 LAB — LIPASE, BLOOD: Lipase: 35 U/L (ref 11–51)

## 2021-07-23 MED ORDER — KETOROLAC TROMETHAMINE 30 MG/ML IJ SOLN
15.0000 mg | Freq: Once | INTRAMUSCULAR | Status: AC
  Administered 2021-07-23: 15 mg via INTRAVENOUS
  Filled 2021-07-23: qty 1

## 2021-07-23 MED ORDER — IOHEXOL 350 MG/ML SOLN
80.0000 mL | Freq: Once | INTRAVENOUS | Status: AC | PRN
  Administered 2021-07-23: 80 mL via INTRAVENOUS

## 2021-07-23 MED ORDER — ACETAMINOPHEN 500 MG PO TABS
1000.0000 mg | ORAL_TABLET | Freq: Once | ORAL | Status: AC
  Administered 2021-07-23: 1000 mg via ORAL
  Filled 2021-07-23: qty 2

## 2021-07-23 NOTE — ED Provider Notes (Signed)
[facility] Emergency Department Provider Note  ____________________________________________   Event Date/Time   First MD Initiated Contact with Patient 07/23/21 1719     (approximate)  I have reviewed the triage vital signs and the nursing notes.   HISTORY  Chief Complaint Back Pain and Abdominal Pain   HPI Nichole Krause is a 35 y.o. female with past medical history of BV and a cesarean section who presents for assessment of some left upper quadrant abdominal pain rating into the left back and left flank as well as up into the left shoulder and down to the left leg.  Patient states initially felt this pain after she was diagnosed with COVID 3 weeks ago but that had since resolved for the last couple days and then came back fairly strong yesterday.  She denies any right-sided symptoms, recent cough, shortness of breath, chest pain, headache area, sore throat, vomiting, diarrhea, pain with urination, blood in her urine, rash or recent falls or injuries.  She has never had a kidney stone or been diagnosed with an ulcer.  No other acute concerns at this time.         Past Medical History:  Diagnosis Date   BV (bacterial vaginosis)     Patient Active Problem List   Diagnosis Date Noted   Vomiting 12/31/2015    Past Surgical History:  Procedure Laterality Date   CESAREAN SECTION      Prior to Admission medications   Medication Sig Start Date End Date Taking? Authorizing Provider  metoCLOPramide (REGLAN) 10 MG tablet Take 1 tablet (10 mg total) by mouth every 8 (eight) hours as needed for nausea or vomiting. 09/29/18 09/29/19  Phineas Semen, MD  ondansetron (ZOFRAN ODT) 4 MG disintegrating tablet Take 1 tablet (4 mg total) by mouth every 8 (eight) hours as needed for nausea or vomiting. Patient not taking: Reported on 07/27/2018 09/19/16   Minna Antis, MD  promethazine (PHENERGAN) 25 MG tablet Take 1 tablet (25 mg total) by mouth every 8  (eight) hours as needed for nausea or vomiting. Patient not taking: Reported on 07/27/2018 10/22/16   Tommi Rumps, PA-C    Allergies Patient has no known allergies.  No family history on file.  Social History Social History   Tobacco Use   Smoking status: Former   Smokeless tobacco: Never  Substance Use Topics   Alcohol use: No   Drug use: No    Review of Systems  Review of Systems  Constitutional:  Negative for chills and fever.  HENT:  Negative for sore throat.   Eyes:  Negative for pain.  Respiratory:  Negative for cough and stridor.   Cardiovascular:  Negative for chest pain.  Gastrointestinal:  Positive for abdominal pain. Negative for vomiting.  Genitourinary:  Positive for flank pain.  Musculoskeletal:  Positive for back pain and myalgias.  Skin:  Negative for rash.  Neurological:  Negative for seizures, loss of consciousness and headaches.  Psychiatric/Behavioral:  Negative for suicidal ideas.   All other systems reviewed and are negative.    ____________________________________________   PHYSICAL EXAM:  VITAL SIGNS: ED Triage Vitals  Enc Vitals Group     BP 07/23/21 1452 113/79     Pulse Rate 07/23/21 1452 (!) 106     Resp 07/23/21 1452 19     Temp 07/23/21 1452 98.5 F (36.9 C)     Temp Source 07/23/21 1452 Oral     SpO2 07/23/21 1452 99 %  Weight --      Height --      Head Circumference --      Peak Flow --      Pain Score 07/23/21 1458 8     Pain Loc --      Pain Edu? --      Excl. in [place]? --    Vitals:   07/23/21 1913 07/23/21 2050  BP: 109/68 106/66  Pulse: (!) 108 95  Resp: 19 15  Temp:    SpO2: 98% 98%   Physical Exam Vitals and nursing note reviewed.  Constitutional:      General: She is not in acute distress.    Appearance: She is well-developed.  HENT:     Head: Normocephalic and atraumatic.     Right Ear: External ear normal.     Left Ear: External ear normal.     Nose: Nose normal.     Mouth/Throat:     Mouth:  Mucous membranes are moist.  Eyes:     Conjunctiva/sclera: Conjunctivae normal.  Cardiovascular:     Rate and Rhythm: Normal rate and regular rhythm.     Heart sounds: No murmur heard. Pulmonary:     Effort: Pulmonary effort is normal. No respiratory distress.     Breath sounds: Normal breath sounds.  Abdominal:     Palpations: Abdomen is soft.     Tenderness: There is no abdominal tenderness. There is no right CVA tenderness or left CVA tenderness.  Musculoskeletal:     Cervical back: Neck supple.  Skin:    General: Skin is warm and dry.     Capillary Refill: Capillary refill takes less than 2 seconds.  Neurological:     General: No focal deficit present.     Mental Status: She is alert.  Psychiatric:        Mood and Affect: Mood normal.     ____________________________________________   LABS (all labs ordered are listed, but only abnormal results are displayed)  Labs Reviewed  COMPREHENSIVE METABOLIC PANEL - Abnormal; Notable for the following components:      Result Value   Glucose, Bld 104 (*)    AST 14 (*)    All other components within normal limits  URINALYSIS, COMPLETE (UACMP) WITH MICROSCOPIC - Abnormal; Notable for the following components:   Color, Urine YELLOW (*)    APPearance HAZY (*)    Ketones, ur 5 (*)    Bacteria, UA RARE (*)    All other components within normal limits  D-DIMER, QUANTITATIVE - Abnormal; Notable for the following components:   D-Dimer, Quant 1.18 (*)    All other components within normal limits  LIPASE, BLOOD  CBC  POC URINE PREG, ED   ____________________________________________  EKG ____________________________________________  RADIOLOGY  ED MD interpretation: CTA chest shows no evidence of PE, pleural effusion, pneumonia or other clear acute intrathoracic process.  CT abdomen pelvis shows no evidence of kidney stone, pyelonephritis, splenic or renal infarct, diverticulitis or other clear acute process.  Ovaries are  unremarkable and IUD is in appropriate position.  No adnexal masses.  Official radiology report(s): CT Angio Chest PE W and/or Wo Contrast  Result Date: 07/23/2021 CLINICAL DATA:  High probability for pulmonary embolus. Pain in the lower back, shooting into the abdomen. Pain radiates into her left arm and leg as well. Patient COVID-19 3 weeks ago. EXAM: CT ANGIOGRAPHY CHEST WITH CONTRAST TECHNIQUE: Multidetector CT imaging of the chest was performed using the standard protocol during bolus administration  of intravenous contrast. Multiplanar CT image reconstructions and MIPs were obtained to evaluate the vascular anatomy. CONTRAST:  57mL OMNIPAQUE IOHEXOL 350 MG/ML SOLN COMPARISON:  None. FINDINGS: Cardiovascular: Satisfactory opacification of the pulmonary arteries to the segmental level. No evidence of pulmonary embolism. Normal heart size. No pericardial effusion. Mediastinum/Nodes: No enlarged mediastinal, hilar, or axillary lymph nodes. Thyroid gland, trachea, and esophagus demonstrate no significant findings. Lungs/Pleura: Lungs are clear. No pleural effusion or pneumothorax. Upper Abdomen: No acute abnormality. Musculoskeletal: No chest wall abnormality. No acute or significant osseous findings. Review of the MIP images confirms the above findings. IMPRESSION: No pulmonary emboli. No cause for the patient's symptoms identified. Electronically Signed   By: Gerome Sam III M.D   On: 07/23/2021 20:24   CT ABDOMEN PELVIS W CONTRAST  Result Date: 07/23/2021 CLINICAL DATA:  Acute abdominal pain.  Left-sided pain. EXAM: CT ABDOMEN AND PELVIS WITH CONTRAST TECHNIQUE: Multidetector CT imaging of the abdomen and pelvis was performed using the standard protocol following bolus administration of intravenous contrast. Performed concurrently with chest CTA, reported separately. CONTRAST:  57mL OMNIPAQUE IOHEXOL 350 MG/ML SOLN COMPARISON:  None. FINDINGS: Lower chest: Assessed on concurrent chest CT, reported  separately. Hepatobiliary: No focal liver abnormality is seen. No gallstones, gallbladder wall thickening, or biliary dilatation. Pancreas: No ductal dilatation or inflammation. Spleen: Elongated but overall normal in size.  No focal abnormality. Adrenals/Urinary Tract: Normal adrenal glands. No hydronephrosis or perinephric edema. 4 mm nonobstructing stone in the lower left kidney. No ureteral stones. Homogeneous renal enhancement. Urinary bladder is only minimally distended, grossly unremarkable. Stomach/Bowel: Decompressed stomach. Unremarkable small bowel without obstruction or inflammation. Fall the cecum located in the right mid abdomen. Appendix is not definitively seen. No appendicitis. Small volume of colonic stool without colonic wall thickening or inflammation. Vascular/Lymphatic: Normal caliber abdominal aorta. Patent portal vein. Patent splenic vein. No portal venous or mesenteric gas. Scattered small mesenteric lymph nodes not enlarged by size criteria, typically reactive. No enlarged lymph nodes in the abdomen or pelvis. Reproductive: IUD within the uterus. Quiescent ovaries. No adnexal mass. Other: Trace free fluid in the pelvis is likely physiologic. No upper abdominal ascites. Tiny fat containing umbilical hernia. No free air or focal fluid collection. Musculoskeletal: Transitional lumbosacral anatomy. Suspected sacralization of L5. There are no acute or suspicious osseous abnormalities. IMPRESSION: 1. No acute abnormality in the abdomen/pelvis or explanation for pain. 2. Nonobstructing left renal stone. Electronically Signed   By: Narda Rutherford M.D.   On: 07/23/2021 20:26    ____________________________________________   PROCEDURES  Procedure(s) performed (including Critical Care):  .1-3 Lead EKG Interpretation  Date/Time: 07/23/2021 10:48 PM Performed by: Gilles Chiquito, MD Authorized by: Gilles Chiquito, MD     Interpretation: non-specific     ECG rate assessment:  tachycardic     Rhythm: sinus tachycardia     Ectopy: none     Conduction: normal     ____________________________________________   INITIAL IMPRESSION / ASSESSMENT AND PLAN / ED COURSE      Presents with above-stated exam for recurrence of some left upper quadrant abdominal pain, left flank pain and left back pain rating into the left shoulder and down the left leg that she states initially had improved over last couple days but came back fairly strong yesterday.  He states that she first felt the pain about 3 weeks ago when she was diagnosed with COVID but has not had any other subsequent COVID symptoms over the last couple days.  On arrival  she is tachycardic 106 otherwise stable vital signs on room air.  She has no significant CVA tenderness rash or significant Donnell tenderness.  When asked to point to where her pain is it is right about where her diaphragm is.  Primary differential includes possible PE, MSK, kidney stone, peptic ulcer versus gastritis, pyelonephritis, splenic or renal artery infarct, diverticulitis.  Lipase of 35 is not consistent with acute pancreatitis.  CMP shows no significant electrode or metabolic derangements.  CBC shows a WBC count of 10.5 and no acute anemia.  Urinalysis has some ketones and rare bacteria but no other evidence of infection i.e. no WBCs, nitrites or leukocyte esterase.  Urine pregnancy test is negative.  Given absence of leukocytosis findings and UA concerning for infection or CVA tenderness or fevers I would lower suspicion for pyelonephritis.  D-dimer is elevated 1.18.  Further assess for possible PE or diverticulitis or kidney stone will obtain CT chest abdomen pelvis.  CTA chest shows no evidence of PE, pleural effusion, pneumonia or other clear acute intrathoracic process.  CT abdomen pelvis shows no evidence of kidney stone, pyelonephritis, splenic or renal infarct, diverticulitis or other clear acute process.  Ovaries are unremarkable and IUD  is in appropriate position.  No adnexal masses.  On my reassessment patient states he felt much better after Tylenol ibuprofen.  Given no clear acute findings on CT chest abdomen and pelvis with otherwise reassuring labs I have a low suspicion for immediate life-threatening pathology.  Additional differential includes possible MSK versus radiculopathy.  Given improvement in symptoms after Tylenol Toradol with otherwise reassuring exam work-up I think she is stable for discharge with close outpatient follow-up.  Discharged stable condition.  Strict return precautions advised and discussed.     ____________________________________________   FINAL CLINICAL IMPRESSION(S) / ED DIAGNOSES  Final diagnoses:  Acute left-sided thoracic back pain  Left upper quadrant abdominal pain    Medications  ketorolac (TORADOL) 30 MG/ML injection 15 mg (15 mg Intravenous Given 07/23/21 1751)  acetaminophen (TYLENOL) tablet 1,000 mg (1,000 mg Oral Given 07/23/21 1752)  iohexol (OMNIPAQUE) 350 MG/ML injection 80 mL (80 mLs Intravenous Contrast Given 07/23/21 1932)     ED Discharge Orders     None        Note:  This document was prepared using Dragon voice recognition software and may include unintentional dictation errors.    Gilles Chiquito, MD 07/23/21 [phone]

## 2021-07-23 NOTE — ED Triage Notes (Signed)
Pt states that she is having pain that starts in her L lower back and shoots to her abdomen- pt states the pain also goes into her L arm and leg- pt states she had COVID about 3 weeks ago which is approximately when the pain started

## 2021-11-24 ENCOUNTER — Other Ambulatory Visit: Payer: Self-pay

## 2021-11-24 ENCOUNTER — Encounter: Payer: Self-pay | Admitting: Emergency Medicine

## 2021-11-24 ENCOUNTER — Emergency Department
Admission: EM | Admit: 2021-11-24 | Discharge: 2021-11-24 | Disposition: A | Discharge status: Home / Self Care | Payer: Medicaid Other | Attending: Emergency Medicine | Admitting: Emergency Medicine

## 2021-11-24 DIAGNOSIS — M5442 Lumbago with sciatica, left side: Secondary | ICD-10-CM | POA: Insufficient documentation

## 2021-11-24 DIAGNOSIS — Z87891 Personal history of nicotine dependence: Secondary | ICD-10-CM | POA: Insufficient documentation

## 2021-11-24 DIAGNOSIS — K2100 Gastro-esophageal reflux disease with esophagitis, without bleeding: Secondary | ICD-10-CM | POA: Diagnosis not present

## 2021-11-24 DIAGNOSIS — M5432 Sciatica, left side: Secondary | ICD-10-CM

## 2021-11-24 DIAGNOSIS — R109 Unspecified abdominal pain: Secondary | ICD-10-CM | POA: Diagnosis present

## 2021-11-24 DIAGNOSIS — M5489 Other dorsalgia: Secondary | ICD-10-CM

## 2021-11-24 LAB — BASIC METABOLIC PANEL
Anion gap: 5 (ref 5–15)
BUN: 14 mg/dL (ref 6–20)
CO2: 28 mmol/L (ref 22–32)
Calcium: 8.8 mg/dL — ABNORMAL LOW (ref 8.9–10.3)
Chloride: 105 mmol/L (ref 98–111)
Creatinine, Ser: 0.77 mg/dL (ref 0.44–1.00)
GFR, Estimated: >60 mL/min (ref >60)
Glucose, Bld: 119 mg/dL — ABNORMAL HIGH (ref 70–99)
Potassium: 3.5 mmol/L (ref 3.5–5.1)
Sodium: 138 mmol/L (ref 135–145)

## 2021-11-24 LAB — URINALYSIS, ROUTINE W REFLEX MICROSCOPIC
Bilirubin Urine: NEGATIVE
Glucose, UA: NEGATIVE mg/dL
Ketones, ur: NEGATIVE mg/dL
Leukocytes,Ua: NEGATIVE
Nitrite: NEGATIVE
Protein, ur: NEGATIVE mg/dL
Specific Gravity, Urine: >1.03 — ABNORMAL HIGH (ref 1.005–1.030)
pH: 6.5 (ref 5.0–8.0)

## 2021-11-24 LAB — URINALYSIS, MICROSCOPIC (REFLEX)

## 2021-11-24 LAB — CBC
HCT: 41.3 % (ref 36.0–46.0)
Hemoglobin: 13.5 g/dL (ref 12.0–15.0)
MCH: 27.8 pg (ref 26.0–34.0)
MCHC: 32.7 g/dL (ref 30.0–36.0)
MCV: 85 fL (ref 80.0–100.0)
Platelets: 178 10*3/uL (ref 150–400)
RBC: 4.86 MIL/uL (ref 3.87–5.11)
RDW: 13.4 % (ref 11.5–15.5)
WBC: 4.6 10*3/uL (ref 4.0–10.5)
nRBC: 0 % (ref 0.0–0.2)

## 2021-11-24 LAB — POC URINE PREG, ED: Preg Test, Ur: NEGATIVE

## 2021-11-24 MED ORDER — ALUM & MAG HYDROXIDE-SIMETH 200-200-20 MG/5ML PO SUSP
30.0000 mL | Freq: Once | ORAL | Status: AC
  Administered 2021-11-24: 30 mL via ORAL
  Filled 2021-11-24: qty 30

## 2021-11-24 MED ORDER — KETOROLAC TROMETHAMINE 60 MG/2ML IM SOLN
60.0000 mg | Freq: Once | INTRAMUSCULAR | Status: AC
  Administered 2021-11-24: 60 mg via INTRAMUSCULAR
  Filled 2021-11-24: qty 2

## 2021-11-24 MED ORDER — LIDOCAINE VISCOUS HCL 2 % MT SOLN
15.0000 mL | Freq: Once | OROMUCOSAL | Status: AC
  Administered 2021-11-24: 15 mL via ORAL
  Filled 2021-11-24: qty 15

## 2021-11-24 NOTE — ED Provider Notes (Signed)
[facility] Emergency Department Provider Note  ____________________________________________   Event Date/Time   First MD Initiated Contact with Patient 11/24/21 1132     (approximate)  I have reviewed the triage vital signs and the nursing notes.   HISTORY  Chief Complaint Abdominal Pain and Back Pain    HPI Nichole Krause is a 35 y.o. female with past medical history as below here with abdominal burning.  The patient states that for the last several days, she has had a mild, generalized, diffuse, abdominal burning sensation.  It is primarily in her anterior abdomen but occasionally to her left side.  She states she has had similar symptoms before.  She was told she had a kidney stone, so she thought it was possibly related to this although she has had no overt hematuria or dysuria or frequency.  The pain does seem to change when she is lying her abdomen or in certain positions, and she has noticed some worsening as well as improvement with certain foods.  She denies any melena.  No hematochezia.  No vomiting.  No dysuria.  No diarrhea.  No known history of ulcers but she does have a strong family history of ulcers.    Past Medical History:  Diagnosis Date   BV (bacterial vaginosis)     Patient Active Problem List   Diagnosis Date Noted   Vomiting 12/31/2015    Past Surgical History:  Procedure Laterality Date   CESAREAN SECTION      Prior to Admission medications   Medication Sig Start Date End Date Taking? Authorizing Provider  metoCLOPramide (REGLAN) 10 MG tablet Take 1 tablet (10 mg total) by mouth every 8 (eight) hours as needed for nausea or vomiting. 09/29/18 09/29/19  Phineas Semen, MD  ondansetron (ZOFRAN ODT) 4 MG disintegrating tablet Take 1 tablet (4 mg total) by mouth every 8 (eight) hours as needed for nausea or vomiting. Patient not taking: Reported on 07/27/2018 09/19/16   Minna Antis, MD  promethazine (PHENERGAN) 25 MG  tablet Take 1 tablet (25 mg total) by mouth every 8 (eight) hours as needed for nausea or vomiting. Patient not taking: Reported on 07/27/2018 10/22/16   Tommi Rumps, PA-C    Allergies Patient has no known allergies.  History reviewed. No pertinent family history.  Social History Social History   Tobacco Use   Smoking status: Former   Smokeless tobacco: Never  Substance Use Topics   Alcohol use: No   Drug use: No    Review of Systems  Review of Systems  Constitutional:  Negative for chills and fever.  HENT:  Negative for sore throat.   Respiratory:  Negative for shortness of breath.   Cardiovascular:  Negative for chest pain.  Gastrointestinal:  Positive for abdominal pain and nausea.  Genitourinary:  Negative for flank pain.  Musculoskeletal:  Negative for neck pain.  Skin:  Negative for rash and wound.  Allergic/Immunologic: Negative for immunocompromised state.  Neurological:  Negative for weakness and numbness.  Hematological:  Does not bruise/bleed easily.  All other systems reviewed and are negative.   ____________________________________________  PHYSICAL EXAM:      VITAL SIGNS: ED Triage Vitals  Enc Vitals Group     BP 11/24/21 0949 133/87     Pulse Rate 11/24/21 0949 (!) 112     Resp 11/24/21 0949 18     Temp 11/24/21 0949 98.2 F (36.8 C)     Temp Source 11/24/21 0949 Oral  SpO2 11/24/21 0949 98 %     Weight 11/24/21 0950 167 lb 15.9 oz (76.2 kg)     Height 11/24/21 0950 5\' 2"  (1.575 m)     Head Circumference --      Peak Flow --      Pain Score 11/24/21 0950 6     Pain Loc --      Pain Edu? --      Excl. in [place]? --      Physical Exam Vitals and nursing note reviewed.  Constitutional:      General: She is not in acute distress.    Appearance: She is well-developed.  HENT:     Head: Normocephalic and atraumatic.  Eyes:     Conjunctiva/sclera: Conjunctivae normal.  Cardiovascular:     Rate and Rhythm: Normal rate and regular rhythm.      Heart sounds: Normal heart sounds.  Pulmonary:     Effort: Pulmonary effort is normal. No respiratory distress.     Breath sounds: No wheezing.  Abdominal:     General: There is no distension.     Tenderness: There is abdominal tenderness (Minimal, no rebound or guarding) in the epigastric area and left upper quadrant.  Musculoskeletal:     Cervical back: Neck supple.     Comments: Mild tenderness to palpation of the left lower paraspinal musculature.  Skin:    General: Skin is warm.     Capillary Refill: Capillary refill takes less than 2 seconds.     Findings: No rash.  Neurological:     Mental Status: She is alert and oriented to person, place, and time.     Motor: No abnormal muscle tone.      ____________________________________________   LABS (all labs ordered are listed, but only abnormal results are displayed)  Labs Reviewed  URINALYSIS, ROUTINE W REFLEX MICROSCOPIC - Abnormal; Notable for the following components:      Result Value   APPearance HAZY (*)    Specific Gravity, Urine >1.030 (*)    Hgb urine dipstick TRACE (*)    All other components within normal limits  BASIC METABOLIC PANEL - Abnormal; Notable for the following components:   Glucose, Bld 119 (*)    Calcium 8.8 (*)    All other components within normal limits  URINALYSIS, MICROSCOPIC (REFLEX) - Abnormal; Notable for the following components:   Bacteria, UA RARE (*)    All other components within normal limits  URINE CULTURE  CBC  POC URINE PREG, ED    ____________________________________________  EKG:  ________________________________________  RADIOLOGY All imaging, including plain films, CT scans, and ultrasounds, independently reviewed by me, and interpretations confirmed via formal radiology reads.  ED MD interpretation:     Official radiology report(s): No results found.  ____________________________________________  PROCEDURES   Procedure(s) performed (including Critical  Care):  Procedures  ____________________________________________  INITIAL IMPRESSION / MDM / [place] / ED COURSE  As part of my medical decision making, I reviewed the following data within the [place] notes reviewed and incorporated, Old chart reviewed, Notes from prior ED visits, and [place] Controlled Substance Database       *Leslee Holton was evaluated in Emergency Department on 11/24/2021 for the symptoms described in the history of present illness. She was evaluated in the context of the global COVID-19 pandemic, which necessitated consideration that the patient might be at risk for infection with the SARS-CoV-2 virus that causes COVID-19. Institutional protocols and algorithms  that pertain to the evaluation of patients at risk for COVID-19 are in a state of rapid change based on information released by regulatory bodies including the CDC and federal and state organizations. These policies and algorithms were followed during the patient's care in the ED.  Some ED evaluations and interventions may be delayed as a result of limited staffing during the pandemic.*     Medical Decision Making: 35 year old female here with multiple complaints.  Regarding her abdominal burning, this is highly consistent with likely GERD/gastritis.  It seems to be worse with certain position changes and food.  It is more of a burning sensation.  She has a family history of GERD/gastritis.  Will treat with antacids.  Regarding her back pain, this also seems to be fairly chronic issue.  I suspect she may have musculoskeletal back pain.  She has a component of some radiation down her leg consistent with sciatica but distal neurovascular is fully intact.  No loss of bowel or bladder function.  No other red flags.  Patient can take NSAIDs but this was discussed to be used sparingly given her possible gastritis and with PPI prophylaxis only.  No other red flags.  She does have a  kidney stone in the left kidney based on prior imaging but has no hematuria, no CVA tenderness, no UA evidence suggest UTI or stone.  Lab work is otherwise reassuring.  No leukocytosis.  No anemia.  BMP with normal renal function.  ____________________________________________  FINAL CLINICAL IMPRESSION(S) / ED DIAGNOSES  Final diagnoses:  Gastroesophageal reflux disease with esophagitis without hemorrhage  Left paraspinal back pain  Sciatica of left side     MEDICATIONS GIVEN DURING THIS VISIT:  Medications  alum & mag hydroxide-simeth (MAALOX/MYLANTA) 200-200-20 MG/5ML suspension 30 mL (30 mLs Oral Given 11/24/21 1257)    And  lidocaine (XYLOCAINE) 2 % viscous mouth solution 15 mL (15 mLs Oral Given 11/24/21 1257)  ketorolac (TORADOL) injection 60 mg (60 mg Intramuscular Given 11/24/21 1330)     ED Discharge Orders     None        Note:  This document was prepared using Dragon voice recognition software and may include unintentional dictation errors.   Duffy Bruce, MD 11/24/21 [phone]

## 2021-11-24 NOTE — ED Notes (Signed)
Patients states over the couple of months has had lower left back pain had CT done here showed a kidney stone. On Tuesday pain got worse went to [facility]. Was given Tamsulosin o.4mg  and Tramadp; 50 mg. States just makes her sleepy. Rates pain an 8.

## 2021-11-24 NOTE — Discharge Instructions (Signed)
For your abdominal burning pain:  START taking an over-the-counter antacid such as OMEPRAZOLE (PREVACID), ESOMEPRAZOLE, or other "PPI" medication, once daily, before eating  You could also take FAMOTIDINE/PEPCID daily  Avoid foods high in acid or spice  FOR YOUR BACK PAIN:  Continue the topical alleve spray  Take an over-the-counter Alleve once daily as needed. Take this WITH food and AFTER your antacid  Take Tylenol 1000 mg every 6 hours for pain as well, if needed  Try to wear supportive shoes at home

## 2021-11-24 NOTE — ED Triage Notes (Signed)
Pt comes into the ED via POV c/o abd pain and back pain that started last Friday.  Pt states she came in Monday but ended up leaving due to wait times.  Pt states they did [facility]ray at [facility] for her, but they weren't able to confirm if she had a kidney stone.  Pt states [facility] placed her on tramadol and tamsulosin.  Pt is having no relief.  Pt in NAD at this time.

## 2021-11-25 LAB — URINE CULTURE
